# Patient Record
Sex: Male | Born: 1960 | Race: Black or African American | Hispanic: No | Smoking: Current every day smoker
Health system: Southern US, Community
[De-identification: ages and names within clinical notes are randomized; demographics above are authoritative.]

## PROBLEM LIST (undated history)

## (undated) DIAGNOSIS — E119 Type 2 diabetes mellitus without complications: Secondary | ICD-10-CM

## (undated) DIAGNOSIS — T7840XA Allergy, unspecified, initial encounter: Secondary | ICD-10-CM

## (undated) DIAGNOSIS — I10 Essential (primary) hypertension: Secondary | ICD-10-CM

## (undated) DIAGNOSIS — I639 Cerebral infarction, unspecified: Secondary | ICD-10-CM

## (undated) DIAGNOSIS — K219 Gastro-esophageal reflux disease without esophagitis: Secondary | ICD-10-CM

## (undated) DIAGNOSIS — R52 Pain, unspecified: Secondary | ICD-10-CM

## (undated) DIAGNOSIS — F329 Major depressive disorder, single episode, unspecified: Secondary | ICD-10-CM

## (undated) DIAGNOSIS — F419 Anxiety disorder, unspecified: Secondary | ICD-10-CM

## (undated) DIAGNOSIS — B20 Human immunodeficiency virus [HIV] disease: Secondary | ICD-10-CM

## (undated) DIAGNOSIS — F32A Depression, unspecified: Secondary | ICD-10-CM

## (undated) DIAGNOSIS — R200 Anesthesia of skin: Secondary | ICD-10-CM

## (undated) DIAGNOSIS — N200 Calculus of kidney: Secondary | ICD-10-CM

## (undated) HISTORY — DX: Anxiety disorder, unspecified: F41.9

## (undated) HISTORY — DX: Cerebral infarction, unspecified: I63.9

## (undated) HISTORY — DX: Anesthesia of skin: R20.0

## (undated) HISTORY — DX: Allergy, unspecified, initial encounter: T78.40XA

## (undated) HISTORY — DX: Depression, unspecified: F32.A

## (undated) HISTORY — DX: Type 2 diabetes mellitus without complications: E11.9

## (undated) HISTORY — DX: Essential (primary) hypertension: I10

## (undated) HISTORY — DX: Calculus of kidney: N20.0

## (undated) HISTORY — DX: Gastro-esophageal reflux disease without esophagitis: K21.9

## (undated) HISTORY — DX: Major depressive disorder, single episode, unspecified: F32.9

## (undated) HISTORY — DX: Human immunodeficiency virus (HIV) disease: B20

## (undated) HISTORY — DX: Pain, unspecified: R52

---
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
## 2014-06-29 DIAGNOSIS — N200 Calculus of kidney: Secondary | ICD-10-CM

## 2014-06-29 HISTORY — PX: LITHOTRIPSY: SUR834

## 2014-06-29 HISTORY — PX: TENDON REPAIR: SHX5111

## 2014-06-29 HISTORY — DX: Calculus of kidney: N20.0

## 2015-10-07 DIAGNOSIS — I639 Cerebral infarction, unspecified: Secondary | ICD-10-CM

## 2015-10-07 HISTORY — DX: Cerebral infarction, unspecified: I63.9

## 2016-03-10 ENCOUNTER — Encounter: Payer: Self-pay | Admitting: Internal Medicine

## 2016-04-03 ENCOUNTER — Encounter: Payer: Self-pay | Admitting: Infectious Diseases

## 2016-04-03 ENCOUNTER — Ambulatory Visit (INDEPENDENT_AMBULATORY_CARE_PROVIDER_SITE_OTHER): Payer: Medicare HMO | Admitting: Infectious Diseases

## 2016-04-03 VITALS — BP 145/103 | HR 77 | Temp 98.1°F | Ht 68.0 in | Wt 204.0 lb

## 2016-04-03 DIAGNOSIS — Z21 Asymptomatic human immunodeficiency virus [HIV] infection status: Secondary | ICD-10-CM | POA: Diagnosis not present

## 2016-04-03 DIAGNOSIS — I6359 Cerebral infarction due to unspecified occlusion or stenosis of other cerebral artery: Secondary | ICD-10-CM

## 2016-04-03 DIAGNOSIS — Z720 Tobacco use: Secondary | ICD-10-CM | POA: Diagnosis not present

## 2016-04-03 DIAGNOSIS — E118 Type 2 diabetes mellitus with unspecified complications: Secondary | ICD-10-CM | POA: Diagnosis not present

## 2016-04-03 DIAGNOSIS — Z79899 Other long term (current) drug therapy: Secondary | ICD-10-CM

## 2016-04-03 DIAGNOSIS — I639 Cerebral infarction, unspecified: Secondary | ICD-10-CM | POA: Insufficient documentation

## 2016-04-03 DIAGNOSIS — Z113 Encounter for screening for infections with a predominantly sexual mode of transmission: Secondary | ICD-10-CM

## 2016-04-03 MED ORDER — ABACAVIR SULFATE-LAMIVUDINE 600-300 MG PO TABS: 1.0000 | ORAL_TABLET | Freq: Every day | ORAL | 3 refills | Status: DC

## 2016-04-03 MED ORDER — RALTEGRAVIR POTASSIUM 400 MG PO TABS: 400.0000 mg | ORAL_TABLET | Freq: Two times a day (BID) | ORAL | 3 refills | Status: DC

## 2016-04-03 MED ORDER — ASPIRIN EC 81 MG PO TBEC: 81.0000 mg | DELAYED_RELEASE_TABLET | Freq: Every day | ORAL | 3 refills | Status: DC
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
## 2016-04-03 NOTE — Assessment & Plan Note (Signed)
Need his vax records He is given condoms He has gotten flu shot.  Will recheck his labs today Will see him back in 4-5 months.  Fortunately he has PCP

## 2016-04-07 ENCOUNTER — Other Ambulatory Visit: Payer: Medicare HMO

## 2016-04-07 ENCOUNTER — Other Ambulatory Visit (HOSPITAL_COMMUNITY)
Admission: RE | Admit: 2016-04-07 | Discharge: 2016-04-07 | Disposition: A | Discharge status: Home / Self Care | Payer: Medicare HMO | Source: Ambulatory Visit | Attending: Infectious Diseases | Admitting: Infectious Diseases

## 2016-04-07 DIAGNOSIS — Z113 Encounter for screening for infections with a predominantly sexual mode of transmission: Secondary | ICD-10-CM | POA: Insufficient documentation

## 2016-04-07 DIAGNOSIS — Z21 Asymptomatic human immunodeficiency virus [HIV] infection status: Secondary | ICD-10-CM

## 2016-04-07 DIAGNOSIS — Z79899 Other long term (current) drug therapy: Secondary | ICD-10-CM

## 2016-04-07 LAB — CBC
HCT: 45.9 % (ref 38.5–50.0)
HEMOGLOBIN: 14.9 g/dL (ref 13.2–17.1)
MCH: 30.2 pg (ref 27.0–33.0)
MCHC: 32.5 g/dL (ref 32.0–36.0)
MCV: 92.9 fL (ref 80.0–100.0)
MPV: 10.7 fL (ref 7.5–12.5)
Platelets: 277 10*3/uL (ref 140–400)
RBC: 4.94 MIL/uL (ref 4.20–5.80)
RDW: 13.3 % (ref 11.0–15.0)
WBC: 9.7 10*3/uL (ref 3.8–10.8)

## 2016-04-07 LAB — TSH: TSH: 0.85 m[IU]/L (ref 0.40–4.50)

## 2016-04-07 NOTE — Addendum Note (Signed)
Addended by: Dolan Amen D on: 04/07/2016 02:50 PM   Modules accepted: Orders

## 2016-04-08 LAB — URINE CYTOLOGY ANCILLARY ONLY
CHLAMYDIA, DNA PROBE: NEGATIVE
Neisseria Gonorrhea: NEGATIVE

## 2016-04-08 LAB — COMPREHENSIVE METABOLIC PANEL
ALBUMIN: 4.4 g/dL (ref 3.6–5.1)
ALT: 15 U/L (ref 9–46)
AST: 16 U/L (ref 10–35)
Alkaline Phosphatase: 64 U/L (ref 40–115)
BUN: 29 mg/dL — ABNORMAL HIGH (ref 7–25)
CHLORIDE: 95 mmol/L — AB (ref 98–110)
CO2: 23 mmol/L (ref 20–31)
CREATININE: 1.86 mg/dL — AB (ref 0.70–1.33)
Calcium: 9.8 mg/dL (ref 8.6–10.3)
Glucose, Bld: 373 mg/dL — ABNORMAL HIGH (ref 65–99)
Potassium: 5 mmol/L (ref 3.5–5.3)
SODIUM: 131 mmol/L — AB (ref 135–146)
Total Bilirubin: 0.3 mg/dL (ref 0.2–1.2)
Total Protein: 7.3 g/dL (ref 6.1–8.1)

## 2016-04-08 LAB — HEPATITIS C ANTIBODY: HCV AB: NEGATIVE

## 2016-04-08 LAB — LIPID PANEL
Cholesterol: 190 mg/dL (ref 125–200)
HDL: 42 mg/dL (ref >=40)
LDL CALC: 90 mg/dL (ref <130)
Total CHOL/HDL Ratio: 4.5 Ratio (ref <=5.0)
Triglycerides: 291 mg/dL — ABNORMAL HIGH (ref <150)
VLDL: 58 mg/dL — ABNORMAL HIGH (ref <30)

## 2016-04-08 LAB — HEPATITIS B SURFACE ANTIBODY,QUALITATIVE: Hep B S Ab: NEGATIVE

## 2016-04-08 LAB — HEPATITIS B SURFACE ANTIGEN: HEP B S AG: NEGATIVE

## 2016-04-08 LAB — HIV-1 RNA ULTRAQUANT REFLEX TO GENTYP+
HIV 1 RNA QUANT: 46 {copies}/mL — AB (ref <20)
HIV-1 RNA QUANT, LOG: 1.66 {Log_copies}/mL — AB (ref <1.30)

## 2016-04-08 LAB — RPR

## 2016-04-08 LAB — HEPATITIS A ANTIBODY, TOTAL: Hep A Total Ab: REACTIVE — AB

## 2016-04-08 LAB — HEMOGLOBIN A1C
HEMOGLOBIN A1C: 11.3 % — AB (ref <5.7)
MEAN PLASMA GLUCOSE: 278 mg/dL

## 2016-04-08 LAB — T-HELPER CELL (CD4) - (RCID CLINIC ONLY)
CD4 % Helper T Cell: 46 % (ref 33–55)
CD4 T Cell Abs: 1540 /uL (ref 400–2700)

## 2016-04-09 LAB — QUANTIFERON TB GOLD ASSAY (BLOOD)
INTERFERON GAMMA RELEASE ASSAY: NEGATIVE
QUANTIFERON NIL VALUE: 0.02 [IU]/mL
QUANTIFERON TB AG MINUS NIL: 0 [IU]/mL

## 2016-04-15 LAB — HLA B*5701: HLA-B 5701 W/RFLX HLA-B HIGH: NEGATIVE

## 2016-04-27 ENCOUNTER — Ambulatory Visit (AMBULATORY_SURGERY_CENTER): Payer: Self-pay | Admitting: *Deleted

## 2016-04-27 VITALS — Ht 68.0 in | Wt 204.6 lb

## 2016-04-27 DIAGNOSIS — Z1211 Encounter for screening for malignant neoplasm of colon: Secondary | ICD-10-CM

## 2016-04-27 MED ORDER — NA SULFATE-K SULFATE-MG SULF 17.5-3.13-1.6 GM/177ML PO SOLN: 1.0000 | Freq: Once | ORAL | 0 refills | Status: AC

## 2016-04-27 NOTE — Progress Notes (Signed)
No allergies to eggs or soy. No problems with anesthesia.  Pt given Emmi instructions for colonoscopy  No oxygen use  No diet drug use  

## 2016-05-11 ENCOUNTER — Ambulatory Visit (AMBULATORY_SURGERY_CENTER): Payer: Medicare HMO | Admitting: Internal Medicine

## 2016-05-11 ENCOUNTER — Encounter: Payer: Self-pay | Admitting: Internal Medicine

## 2016-05-11 VITALS — BP 109/69 | HR 89 | Temp 98.6°F | Resp 17 | Ht 68.0 in | Wt 204.0 lb

## 2016-05-11 DIAGNOSIS — Z1211 Encounter for screening for malignant neoplasm of colon: Secondary | ICD-10-CM

## 2016-05-11 DIAGNOSIS — Z1212 Encounter for screening for malignant neoplasm of rectum: Secondary | ICD-10-CM

## 2016-05-11 DIAGNOSIS — D120 Benign neoplasm of cecum: Secondary | ICD-10-CM | POA: Diagnosis not present

## 2016-05-11 DIAGNOSIS — D128 Benign neoplasm of rectum: Secondary | ICD-10-CM

## 2016-05-11 DIAGNOSIS — D129 Benign neoplasm of anus and anal canal: Secondary | ICD-10-CM

## 2016-05-11 LAB — GLUCOSE, CAPILLARY
GLUCOSE-CAPILLARY: 139 mg/dL — AB (ref 65–99)
GLUCOSE-CAPILLARY: 145 mg/dL — AB (ref 65–99)

## 2016-05-11 MED ORDER — SODIUM CHLORIDE 0.9 % IV SOLN: 500.0000 mL | INTRAVENOUS | Status: DC

## 2016-05-11 NOTE — Progress Notes (Signed)
Report given to PACU RN, vss 

## 2016-05-11 NOTE — Op Note (Signed)
[facility] Patient Name: Donald Ross Procedure Date: 05/11/2016 10:40 AM MRN: [id number] Endoscopist: Docia Chuck. Henrene Pastor , MD Age: 55 Referring MD:  Date of Birth: [birth year] Gender: Male Account #: [id number] Procedure:                Colonoscopy, with cold snare polypectomy X2 Indications:              Screening for colorectal malignant neoplasm Medicines:                Monitored Anesthesia Care Procedure:                Pre-Anesthesia Assessment:                           - Prior to the procedure, a History and Physical                            was performed, and patient medications and                            allergies were reviewed. The patient's tolerance of                            previous anesthesia was also reviewed. The risks                            and benefits of the procedure and the sedation                            options and risks were discussed with the patient.                            All questions were answered, and informed consent                            was obtained. Prior Anticoagulants: The patient has                            taken no previous anticoagulant or antiplatelet                            agents. ASA Grade Assessment: II - A patient with                            mild systemic disease. After reviewing the risks                            and benefits, the patient was deemed in                            satisfactory condition to undergo the procedure.                           After obtaining informed consent, the colonoscope  was passed under direct vision. Throughout the                            procedure, the patient's blood pressure, pulse, and                            oxygen saturations were monitored continuously. The                            Model CF-HQ190L [id number]) scope was introduced                            through the anus and advanced to the the cecum,                 identified by appendiceal orifice and ileocecal                            valve. The ileocecal valve, appendiceal orifice,                            and rectum were photographed. The quality of the                            bowel preparation was good. The colonoscopy was                            performed without difficulty. The patient tolerated                            the procedure well. The bowel preparation used was                            SUPREP. Scope In: 10:42:47 AM Scope Out: 11:01:49 AM Scope Withdrawal Time: 0 hours 11 minutes 32 seconds  Total Procedure Duration: 0 hours 19 minutes 2 seconds  Findings:                 Two polyps were found in the rectum and cecum. The                            polyps were 3 to 6 mm in size. These polyps were                            removed with a cold snare. Resection and retrieval                            were complete.                           Multiple diverticula were found in the left colon.                           The exam was otherwise without abnormality on  direct and retroflexion views. Complications:            No immediate complications. Estimated blood loss:                            None. Estimated Blood Loss:     Estimated blood loss: none. Impression:               - Two 3 to 6 mm polyps in the rectum and in the                            cecum, removed with a cold snare. Resected and                            retrieved.                           - Diverticulosis in the left colon.                           - The examination was otherwise normal on direct                            and retroflexion views. Recommendation:           - Repeat colonoscopy in 5 years for surveillance,                            if either polyp adenomatous.                           - Patient has a contact number available for                            emergencies. The signs and symptoms  of potential                            delayed complications were discussed with the                            patient. Return to normal activities tomorrow.                            Written discharge instructions were provided to the                            patient.                           - Resume previous diet.                           - Continue present medications.                           - Await pathology results. Docia Chuck. Henrene Pastor, MD 05/11/2016 11:07:11 AM This report has been signed electronically.

## 2016-05-11 NOTE — Patient Instructions (Signed)
YOU HAD AN ENDOSCOPIC PROCEDURE TODAY AT [facility]:   Refer to the procedure report that was given to you for any specific questions about what was found during the examination.  If the procedure report does not answer your questions, please call your gastroenterologist to clarify.  If you requested that your care partner not be given the details of your procedure findings, then the procedure report has been included in a sealed envelope for you to review at your convenience later.  YOU SHOULD EXPECT: Some feelings of bloating in the abdomen. Passage of more gas than usual.  Walking can help get rid of the air that was put into your GI tract during the procedure and reduce the bloating. If you had a lower endoscopy (such as a colonoscopy or flexible sigmoidoscopy) you may notice spotting of blood in your stool or on the toilet paper. If you underwent a bowel prep for your procedure, you may not have a normal bowel movement for a few days.  Please Note:  You might notice some irritation and congestion in your nose or some drainage.  This is from the oxygen used during your procedure.  There is no need for concern and it should clear up in a day or so.  SYMPTOMS TO REPORT IMMEDIATELY:   Following lower endoscopy (colonoscopy or flexible sigmoidoscopy):  Excessive amounts of blood in the stool  Significant tenderness or worsening of abdominal pains  Swelling of the abdomen that is new, acute  Fever of 100F or higher    For urgent or emergent issues, a gastroenterologist can be reached at any hour by calling [phone].   DIET:  We do recommend a small meal at first, but then you may proceed to your regular diet.  Drink plenty of fluids but you should avoid alcoholic beverages for 24 hours.  ACTIVITY:  You should plan to take it easy for the rest of today and you should NOT DRIVE or use heavy machinery until tomorrow (because of the sedation medicines used during the test).     FOLLOW UP: Our staff will call the number listed on your records the next business day following your procedure to check on you and address any questions or concerns that you may have regarding the information given to you following your procedure. If we do not reach you, we will leave a message.  However, if you are feeling well and you are not experiencing any problems, there is no need to return our call.  We will assume that you have returned to your regular daily activities without incident.  If any biopsies were taken you will be contacted by phone or by letter within the next 1-3 weeks.  Please call us at [phone] if you have not heard about the biopsies in 3 weeks.    SIGNATURES/CONFIDENTIALITY: You and/or your care partner have signed paperwork which will be entered into your electronic medical record.  These signatures attest to the fact that that the information above on your After Visit Summary has been reviewed and is understood.  Full responsibility of the confidentiality of this discharge information lies with you and/or your care-partner.   INFORMATION ON POLYPS,DIVERTICULOSIS ,AND HIGH FIBER DIET GIVEN TO YOU TODAY   CONTINUE PREVIOUS DIET AND MEDICATIONS   AWAIT PATHOLOGY RESULTS

## 2016-05-11 NOTE — Progress Notes (Signed)
Called to room to assist during endoscopic procedure.  Patient ID and intended procedure confirmed with present staff. Received instructions for my participation in the procedure from the performing physician.  

## 2016-05-12 ENCOUNTER — Telehealth: Payer: Self-pay

## 2016-05-12 NOTE — Telephone Encounter (Signed)
  Follow up Call-  Call back number 05/11/2016  Post procedure Call Back phone  # [phone]  Permission to leave phone message Yes     Patient questions:  Do you have a fever, pain , or abdominal swelling? No. Pain Score  0 *  Have you tolerated food without any problems? Yes.    Have you been able to return to your normal activities? Yes.    Do you have any questions about your discharge instructions: Diet   No. Medications  No. Follow up visit  No.  Do you have questions or concerns about your Care? No.  Actions: * If pain score is 4 or above: No action needed, pain <4.

## 2016-05-18 ENCOUNTER — Encounter: Payer: Self-pay | Admitting: Internal Medicine

## 2016-07-22 ENCOUNTER — Other Ambulatory Visit: Payer: Medicare HMO

## 2016-08-05 ENCOUNTER — Encounter: Payer: Self-pay | Admitting: Infectious Diseases

## 2016-08-05 ENCOUNTER — Ambulatory Visit (INDEPENDENT_AMBULATORY_CARE_PROVIDER_SITE_OTHER): Payer: Medicare HMO | Admitting: Infectious Diseases

## 2016-08-05 VITALS — BP 145/96 | HR 128 | Temp 97.9°F | Ht 68.0 in | Wt 203.0 lb

## 2016-08-05 DIAGNOSIS — E785 Hyperlipidemia, unspecified: Secondary | ICD-10-CM

## 2016-08-05 DIAGNOSIS — E1340 Other specified diabetes mellitus with diabetic neuropathy, unspecified: Secondary | ICD-10-CM | POA: Insufficient documentation

## 2016-08-05 DIAGNOSIS — B20 Human immunodeficiency virus [HIV] disease: Secondary | ICD-10-CM

## 2016-08-05 DIAGNOSIS — E118 Type 2 diabetes mellitus with unspecified complications: Secondary | ICD-10-CM | POA: Diagnosis not present

## 2016-08-05 DIAGNOSIS — Z23 Encounter for immunization: Secondary | ICD-10-CM

## 2016-08-05 DIAGNOSIS — Z21 Asymptomatic human immunodeficiency virus [HIV] infection status: Secondary | ICD-10-CM | POA: Diagnosis not present

## 2016-08-05 DIAGNOSIS — E1169 Type 2 diabetes mellitus with other specified complication: Secondary | ICD-10-CM | POA: Diagnosis not present

## 2016-08-05 DIAGNOSIS — Z113 Encounter for screening for infections with a predominantly sexual mode of transmission: Secondary | ICD-10-CM | POA: Diagnosis not present

## 2016-08-05 LAB — LIPID PANEL
CHOL/HDL RATIO: 2.9 ratio (ref <5.0)
CHOLESTEROL: 118 mg/dL (ref <200)
HDL: 41 mg/dL (ref >40)
LDL CALC: 44 mg/dL (ref <100)
TRIGLYCERIDES: 165 mg/dL — AB (ref <150)
VLDL: 33 mg/dL — AB (ref <30)

## 2016-08-05 LAB — CBC
HCT: 40 % (ref 38.5–50.0)
Hemoglobin: 12.8 g/dL — ABNORMAL LOW (ref 13.2–17.1)
MCH: 28.3 pg (ref 27.0–33.0)
MCHC: 32 g/dL (ref 32.0–36.0)
MCV: 88.5 fL (ref 80.0–100.0)
MPV: 10 fL (ref 7.5–12.5)
PLATELETS: 392 10*3/uL (ref 140–400)
RBC: 4.52 MIL/uL (ref 4.20–5.80)
RDW: 15.4 % — ABNORMAL HIGH (ref 11.0–15.0)
WBC: 8.8 10*3/uL (ref 3.8–10.8)

## 2016-08-05 LAB — COMPREHENSIVE METABOLIC PANEL
ALT: 15 U/L (ref 9–46)
AST: 17 U/L (ref 10–35)
Albumin: 4 g/dL (ref 3.6–5.1)
Alkaline Phosphatase: 56 U/L (ref 40–115)
BUN: 22 mg/dL (ref 7–25)
CHLORIDE: 107 mmol/L (ref 98–110)
CO2: 21 mmol/L (ref 20–31)
CREATININE: 1.78 mg/dL — AB (ref 0.70–1.33)
Calcium: 9.3 mg/dL (ref 8.6–10.3)
Glucose, Bld: 106 mg/dL — ABNORMAL HIGH (ref 65–99)
POTASSIUM: 4.7 mmol/L (ref 3.5–5.3)
Sodium: 138 mmol/L (ref 135–146)
TOTAL PROTEIN: 6.8 g/dL (ref 6.1–8.1)
Total Bilirubin: 0.2 mg/dL (ref 0.2–1.2)

## 2016-08-05 MED ORDER — ABACAVIR-DOLUTEGRAVIR-LAMIVUD 600-50-300 MG PO TABS: 1.0000 | ORAL_TABLET | Freq: Every day | ORAL | 5 refills | Status: DC

## 2016-08-05 MED ORDER — ABACAVIR-DOLUTEGRAVIR-LAMIVUD 600-50-300 MG PO TABS: 1.0000 | ORAL_TABLET | Freq: Every day | ORAL | Status: DC

## 2016-08-05 NOTE — Assessment & Plan Note (Signed)
He asks for pain rx which we don't give.  He will f/u with his PCP Not clear if his pain is from HIV, CVA, DM or any/all of above.

## 2016-08-05 NOTE — Addendum Note (Signed)
Addended by: Roma Kayser on: 08/05/2016 01:41 PM   Modules accepted: Orders

## 2016-08-05 NOTE — Addendum Note (Signed)
Addended by: Vivyan Biggers C on: 08/05/2016 11:51 AM   Modules accepted: Orders
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
## 2016-08-05 NOTE — Assessment & Plan Note (Signed)
Will change him to triumeq.  Given condoms.  Has gotten flu shot  Needs mening vax.  Check labs today rtc in 6 months.

## 2016-08-05 NOTE — Assessment & Plan Note (Signed)
Will f/u with PCP Will have him seen by podiatry Encourage wt loss.  Has seen ophtho.

## 2016-08-05 NOTE — Assessment & Plan Note (Signed)
Will check his lipids today.  Continue fibrate.  F/u with PCP

## 2016-08-05 NOTE — Addendum Note (Signed)
Addended by: Landis Gandy on: 08/05/2016 03:09 PM   Modules accepted: Orders

## 2016-08-06 LAB — T-HELPER CELL (CD4) - (RCID CLINIC ONLY)
CD4 T CELL HELPER: 49 % (ref 33–55)
CD4 T Cell Abs: 1710 /uL (ref 400–2700)

## 2016-08-06 LAB — RPR

## 2016-08-12 LAB — HIV-1 RNA QUANT-NO REFLEX-BLD
HIV 1 RNA Quant: <20 copies/mL
HIV-1 RNA Quant, Log: <1.3 Log copies/mL

## 2016-11-05 ENCOUNTER — Ambulatory Visit (INDEPENDENT_AMBULATORY_CARE_PROVIDER_SITE_OTHER): Payer: Medicare HMO | Admitting: Podiatry

## 2016-11-05 ENCOUNTER — Encounter: Payer: Self-pay | Admitting: Podiatry

## 2016-11-05 VITALS — BP 158/112 | HR 92

## 2016-11-05 DIAGNOSIS — Q828 Other specified congenital malformations of skin: Secondary | ICD-10-CM

## 2016-11-05 DIAGNOSIS — E1142 Type 2 diabetes mellitus with diabetic polyneuropathy: Secondary | ICD-10-CM

## 2016-11-05 DIAGNOSIS — B351 Tinea unguium: Secondary | ICD-10-CM | POA: Diagnosis not present

## 2016-11-05 DIAGNOSIS — M79676 Pain in unspecified toe(s): Secondary | ICD-10-CM

## 2016-11-05 NOTE — Progress Notes (Signed)
   Subjective:    Patient ID: Donald Ross, male    DOB: [birth year], 56 y.o.   MRN: [id number]  HPI this patient presents the office for an evaluation of his diabetic feet. Patient was referred to this office by Dr[facility]. He states says he has long thick painful nails when he walks and wears his shoes. He also says he has painful callus on the bottom of both feet. Patient states he is diabetic with neuropathy. Also, he says he had a mild stroke on his left side. He presents the office today for preventative foot care services and a diabetic exam on of his diabetic feet    Review of Systems  Eyes: Positive for pain, redness and itching.  Endocrine: Positive for cold intolerance and heat intolerance.       Excessive thirst  Musculoskeletal: Positive for back pain and gait problem.       Muscle pain,joint pain  Skin:       Change in nails  Neurological: Positive for weakness, light-headedness, numbness and headaches.  Hematological:       Slow to heal  Psychiatric/Behavioral: Positive for behavioral problems.       Objective:   Physical Exam GENERAL APPEARANCE: Alert, conversant. Appropriately groomed. No acute distress.  VASCULAR: Pedal pulses are  palpable at  [facility] and PT bilateral.  Capillary refill time is immediate to all digits,  Normal temperature gradient.   NEUROLOGIC: sensation is diminished l to 5.07 monofilament at 5/5 sites bilateral.  Light touch is intact bilateral, Muscle strength normal.  MUSCULOSKELETAL: acceptable muscle strength, tone and stability bilateral.  Intrinsic muscluature intact bilateral.  Rectus appearance of foot and digits noted bilateral.   DERMATOLOGIC: skin color, texture, and turgor are within normal limits.  No preulcerative lesions or ulcers  are seen, no interdigital maceration noted.  No open lesions present.  . No drainage noted. Porokeratosis sub 1  B/L with callus sub hallux  B/L and finally callus sub 5th left  foot.  NAILS  Thick disfigured discolored nails both feet.         Assessment & Plan:  Onychomycosis  B/L  Porokeratosis B/L.  Porokeratosis sub 5th metatarsal left foot  IE  Debridemnt of callus  Debridement of nails  B/L.     Gardiner Barefoot DPM

## 2017-03-09 ENCOUNTER — Ambulatory Visit (INDEPENDENT_AMBULATORY_CARE_PROVIDER_SITE_OTHER): Payer: Medicare HMO | Admitting: Neurology

## 2017-03-09 ENCOUNTER — Encounter: Payer: Self-pay | Admitting: Neurology

## 2017-03-09 VITALS — BP 141/90 | HR 106 | Ht 68.0 in | Wt 196.0 lb

## 2017-03-09 DIAGNOSIS — R52 Pain, unspecified: Secondary | ICD-10-CM | POA: Diagnosis not present

## 2017-03-09 DIAGNOSIS — I6359 Cerebral infarction due to unspecified occlusion or stenosis of other cerebral artery: Secondary | ICD-10-CM | POA: Diagnosis not present

## 2017-03-09 DIAGNOSIS — E118 Type 2 diabetes mellitus with unspecified complications: Secondary | ICD-10-CM

## 2017-03-09 DIAGNOSIS — Z21 Asymptomatic human immunodeficiency virus [HIV] infection status: Secondary | ICD-10-CM

## 2017-03-09 MED ORDER — NORTRIPTYLINE HCL 25 MG PO CAPS: 50.0000 mg | ORAL_CAPSULE | Freq: Every day | ORAL | 11 refills | Status: DC

## 2017-03-09 NOTE — Progress Notes (Signed)
PATIENT: Donald Ross DOB: 1961/02/20  Chief Complaint  Patient presents with  . Numbness/Pain    Reports persistent numbness on left side of body. He has constant pain in neck, shoulders, back, legs and feet. No relief of symptoms with duloxetine or topiramate.  He is currently taking cyclobenzaprine, gabapentin, Tramadol which is only mildly helpful.  He has history of CVA in April 2017.  Marland Kitchen PCP    Bernerd Limbo, MD     HISTORICAL  Donald Ross is a 57 year old male, seen in refer by his primary care physician Dr. Bernerd Limbo, for evaluation of persistent numbness on the left side of the body, also complains of pain in the neck, shoulder, initial evaluation was on September eleventh 2018.  I have reviewed and summarized the referring note, he had a history of diabetes, diabetic peripheral neuropathy, history of stroke 2017 with left-sided hemiparesthesia, hypertension, hyperlipidemia, depression, HIV since 2000, been complying with his anti-virus medications, CD4 is 1710 in February 2018  He complains of chronic diffuse body achy pain since 2005, the year he was diagnosed with diabetes, he had previous work related bilateral trapezius muscle strain, complains of neck pain, bilateral shoulder pain, traveling down to his spine, low back pain, lower extremity pain, bilateral feet paresthesia due to his diabetic peripheral neuropathy, previously he was treated as [place], reported good response to Vicodin 10 mg twice a day, he is apparently not happy with his current pain control with tramadol alone, he has tried over-the-counter NSAIDs, which seems to help him, but he has developed abnormal kidney function, Tylenol did not help, reported previously has tried gabapentin up to 800 mg 3 times a day with limited help, insurance would not pay for Lyrica, Cymbalta was not helpful,  He still has a left side numbness, paresthesia, mild gait abnormality, tendency to fall due to left side leg  of sensation, drop things from his left hand,  We reviewed laboratory evaluation in 2018, A1c was 7.9, creatinine is 1.55,UA showed protein 100, normal CBC, hemoglobin 14.7, negative hepatitis C, check lites right 433, cholesterol was 222, negative RPR, HIV was positive, CD4 count in February 2018 was 1710, 49%  REVIEW OF SYSTEMS: Full 14 system review of systems performed and notable only for memory loss, headaches, numbness, weakness, insomnia, snoring, depression anxiety not enough sleep decreased energy disinterested in activities, increased thirst, achy muscles, fatigue  ALLERGIES: Allergies  Allergen Reactions  . Clonazepam Swelling    HOME MEDICATIONS: Current Outpatient Prescriptions  Medication Sig Dispense Refill  . abacavir-dolutegravir-lamiVUDine (TRIUMEQ) 600-50-300 MG tablet Take 1 tablet by mouth daily. 30 tablet 5  . aspirin EC 81 MG tablet Take 1 tablet (81 mg total) by mouth daily. 90 tablet 3  . cyclobenzaprine (FLEXERIL) 10 MG tablet Take 10 mg by mouth at bedtime.    . fenofibrate micronized (LOFIBRA) 134 MG capsule Take 134 mg by mouth daily before breakfast.    . FLUoxetine (PROZAC) 20 MG capsule Take 20 mg by mouth daily.    Marland Kitchen gabapentin (NEURONTIN) 800 MG tablet Take 800 mg by mouth 3 (three) times daily.    . Insulin Glargine (LANTUS SOLOSTAR) 100 UNIT/ML Solostar Pen Inject into the skin.    Marland Kitchen losartan-hydrochlorothiazide (HYZAAR) 50-12.5 MG tablet Take 1 tablet by mouth daily.    . metFORMIN (GLUCOPHAGE) 1000 MG tablet Take 1,000 mg by mouth 2 (two) times daily with a meal.    . omeprazole (PRILOSEC) 40 MG capsule Take 40 mg by mouth  daily.    . sitaGLIPtin (JANUVIA) 50 MG tablet Take 50 mg by mouth.    . traMADol (ULTRAM) 50 MG tablet Take by mouth 2 (two) times daily.     Current Facility-Administered Medications  Medication Dose Route Frequency Provider Last Rate Last Dose  . 0.9 %  sodium chloride infusion  500 mL Intravenous Continuous Irene Shipper, MD       . abacavir-dolutegravir-lamiVUDine (TRIUMEQ) 096-04-540 MG per tablet 1 tablet  1 tablet Oral Daily Campbell Riches, MD        PAST MEDICAL HISTORY: Past Medical History:  Diagnosis Date  . Allergy   . Anxiety   . Depression   . Diabetes mellitus without complication ([place])   . GERD (gastroesophageal reflux disease)   . HIV infection ([place])   . Hypertension   . Kidney stones 2016  . Numbness   . Pain   . Stroke [facility]) 10/07/2015    PAST SURGICAL HISTORY: Past Surgical History:  Procedure Laterality Date  . EYE SURGERY Bilateral 1968  . LITHOTRIPSY  2016  . TENDON REPAIR Left 2016   thumb    FAMILY HISTORY: Family History  Problem Relation Age of Onset  . CVA Mother        3 strokes  . Alzheimer's disease Mother   . Colon cancer Neg Hx     SOCIAL HISTORY:  Social History   Social History  . Marital status: Legally Separated    Spouse name: N/A  . Number of children: 2  . Years of education: GED   Occupational History  . Unemployed    Social History Main Topics  . Smoking status: Current Every Day Smoker    Packs/day: 0.50    Years: 39.00    Types: Cigarettes    Start date: 04/03/1977  . Smokeless tobacco: Never Used  . Alcohol use No  . Drug use: No  . Sexual activity: Yes    Partners: Female    Birth control/ protection: None     Comment: condomds given   Other Topics Concern  . Not on file   Social History Narrative   Lives at home with fiancee.   Right-handed.   Drinks 3-liter of soda every three days.     PHYSICAL EXAM   Vitals:   03/09/17 1327  BP: (!) 141/90  Pulse: (!) 106  Weight: 196 lb (88.9 kg)  Height: 5\' 8"  (1.727 m)    Not recorded      Body mass index is 29.8 kg/m.  PHYSICAL EXAMNIATION:  Gen: NAD, conversant, well nourised, obese, well groomed                     Cardiovascular: Regular rate rhythm, no peripheral edema, warm, nontender. Eyes: Conjunctivae clear without exudates or hemorrhage Neck:  Supple, no carotid bruits. Pulmonary: Clear to auscultation bilaterally   NEUROLOGICAL EXAM:  MENTAL STATUS:Depressed looking middle-age male Speech:    Speech is normal; fluent and spontaneous with normal comprehension.  Cognition:     Orientation to time, place and person     Normal recent and remote memory     Normal Attention span and concentration     Normal Language, naming, repeating,spontaneous speech     Fund of knowledge   CRANIAL NERVES: CN II: Visual fields are full to confrontation. Fundoscopic exam is normal with sharp discs and no vascular changes. Pupils are round equal and briskly reactive to light. CN III, IV, VI: extraocular movement  are normal. No ptosis. CN V: Facial sensation is intact to pinprick in all 3 divisions bilaterally. Corneal responses are intact.  CN VII: Face is symmetric with normal eye closure and smile. CN VIII: Hearing is normal to rubbing fingers CN IX, X: Palate elevates symmetrically. Phonation is normal. CN XI: Head turning and shoulder shrug are intact CN XII: Tongue is midline with normal movements and no atrophy.  MOTOR: There is no pronator drift of out-stretched arms. Muscle bulk and tone are normal. Muscle strength is normal.  REFLEXES: Reflexes are 2+ and symmetric at the biceps, triceps, knees, and ankles. Plantar responses are flexor.  SENSORY: Left-sided graphic agnosia  COORDINATION: Rapid alternating movements and fine finger movements are intact. There is no dysmetria on finger-to-nose and heel-knee-shin.    GAIT/STANCE: Tends to drag his left leg, fairly steady   DIAGNOSTIC DATA (LABS, IMAGING, TESTING) - I reviewed patient records, labs, notes, testing and imaging myself where available.   ASSESSMENT AND PLAN  Donald Ross is a 56 y.o. male   Diffuse body achy pain History of stroke with residual left-sided hemiparesthesia  MRI of the brain without contrast  Keep aspirin daily  He has vascular risk factors  of hypertension, diabetes, under suboptimal control, hyperlipidemia, HIV,  Laboratory evaluation to rule out inflammatory etiology  Nortriptyline 25 mg titrating to 50 mg every night  Marcial Pacas, M.D. Ph.D.  [facility] [address] [place], [place] [address] Ph: [phone] Fax: [phone]  CC:Bernerd Limbo, MD

## 2017-03-10 ENCOUNTER — Encounter: Payer: Self-pay | Admitting: *Deleted

## 2017-03-10 LAB — SEDIMENTATION RATE: Sed Rate: 32 mm/hr — ABNORMAL HIGH (ref 0–30)

## 2017-03-10 LAB — CK: Total CK: 188 U/L (ref 24–204)

## 2017-03-10 LAB — C-REACTIVE PROTEIN: CRP: 3.2 mg/L (ref 0.0–4.9)

## 2017-03-10 LAB — HEPATITIS PANEL, ACUTE
HEP A IGM: NEGATIVE
HEP B S AG: NEGATIVE
Hep B C IgM: NEGATIVE
Hep C Virus Ab: <0.1 s/co ratio (ref 0.0–0.9)

## 2017-03-10 LAB — ANA W/REFLEX: Anti Nuclear Antibody(ANA): NEGATIVE

## 2017-03-10 LAB — VITAMIN B12: Vitamin B-12: 492 pg/mL (ref 232–1245)

## 2017-03-10 LAB — TSH: TSH: 1.03 u[IU]/mL (ref 0.450–4.500)

## 2017-03-10 LAB — VITAMIN D 25 HYDROXY (VIT D DEFICIENCY, FRACTURES): Vit D, 25-Hydroxy: 20.4 ng/mL — ABNORMAL LOW (ref 30.0–100.0)

## 2017-03-15 ENCOUNTER — Other Ambulatory Visit: Payer: Medicare HMO

## 2017-03-27 ENCOUNTER — Other Ambulatory Visit: Payer: Medicare HMO

## 2017-03-29 ENCOUNTER — Ambulatory Visit: Payer: Medicare HMO | Admitting: Infectious Diseases

## 2017-04-06 ENCOUNTER — Other Ambulatory Visit: Payer: Self-pay | Admitting: Infectious Diseases

## 2017-04-06 DIAGNOSIS — B20 Human immunodeficiency virus [HIV] disease: Secondary | ICD-10-CM

## 2017-04-06 DIAGNOSIS — Z21 Asymptomatic human immunodeficiency virus [HIV] infection status: Secondary | ICD-10-CM

## 2017-04-09 ENCOUNTER — Ambulatory Visit
Admission: RE | Admit: 2017-04-09 | Discharge: 2017-04-09 | Disposition: A | Discharge status: Home / Self Care | Payer: Medicare HMO | Source: Ambulatory Visit | Attending: Neurology | Admitting: Neurology

## 2017-04-09 DIAGNOSIS — E118 Type 2 diabetes mellitus with unspecified complications: Secondary | ICD-10-CM

## 2017-04-09 DIAGNOSIS — Z21 Asymptomatic human immunodeficiency virus [HIV] infection status: Secondary | ICD-10-CM

## 2017-04-09 DIAGNOSIS — R52 Pain, unspecified: Secondary | ICD-10-CM

## 2017-04-09 DIAGNOSIS — I6359 Cerebral infarction due to unspecified occlusion or stenosis of other cerebral artery: Secondary | ICD-10-CM

## 2017-04-12 ENCOUNTER — Telehealth: Payer: Self-pay | Admitting: Neurology

## 2017-04-12 ENCOUNTER — Encounter: Payer: Self-pay | Admitting: Infectious Diseases

## 2017-04-12 ENCOUNTER — Ambulatory Visit (INDEPENDENT_AMBULATORY_CARE_PROVIDER_SITE_OTHER): Payer: Medicare HMO | Admitting: Infectious Diseases

## 2017-04-12 VITALS — BP 167/106 | HR 114 | Temp 97.8°F | Wt 198.0 lb

## 2017-04-12 DIAGNOSIS — B20 Human immunodeficiency virus [HIV] disease: Secondary | ICD-10-CM

## 2017-04-12 DIAGNOSIS — I6359 Cerebral infarction due to unspecified occlusion or stenosis of other cerebral artery: Secondary | ICD-10-CM

## 2017-04-12 DIAGNOSIS — Z113 Encounter for screening for infections with a predominantly sexual mode of transmission: Secondary | ICD-10-CM | POA: Diagnosis not present

## 2017-04-12 DIAGNOSIS — E118 Type 2 diabetes mellitus with unspecified complications: Secondary | ICD-10-CM

## 2017-04-12 DIAGNOSIS — Z720 Tobacco use: Secondary | ICD-10-CM | POA: Diagnosis not present

## 2017-04-12 DIAGNOSIS — Z21 Asymptomatic human immunodeficiency virus [HIV] infection status: Secondary | ICD-10-CM

## 2017-04-12 DIAGNOSIS — I10 Essential (primary) hypertension: Secondary | ICD-10-CM | POA: Insufficient documentation

## 2017-04-12 DIAGNOSIS — Z23 Encounter for immunization: Secondary | ICD-10-CM

## 2017-04-12 DIAGNOSIS — I679 Cerebrovascular disease, unspecified: Secondary | ICD-10-CM

## 2017-04-12 DIAGNOSIS — R52 Pain, unspecified: Secondary | ICD-10-CM | POA: Diagnosis not present

## 2017-04-12 MED ORDER — ABACAVIR-DOLUTEGRAVIR-LAMIVUD 600-50-300 MG PO TABS: 1.0000 | ORAL_TABLET | Freq: Every day | ORAL | 3 refills | Status: DC

## 2017-04-12 NOTE — Telephone Encounter (Signed)
MRI of the brain showed evidence of chronic lacunar infarction in the right pons, supratentorium small vessel disease no acute abnormality, I will review MRI films with him at next follow-up visit.  I have ordered ECHO, US carotid to complete vascular evaluations.  IMPRESSION:  This MRI of the brain without contrast shows the following: 1.   Chronic lacunar infarction in the right pons. 2.    T2/FLAIR hyperintense foci in the hemispheres and left thalamus consistent with moderate chronic microvascular ischemic change. 3.    There are no acute findings.

## 2017-04-12 NOTE — Telephone Encounter (Signed)
Spoke to patient - he is aware of MRI results.  He is agreeable to having ECHO and US Carotid.  He is aware to expect a call for scheduling.  He will keep his pending appt in December with Dr. Krista Blue.

## 2017-04-12 NOTE — Progress Notes (Signed)
   Subjective:    Patient ID: Donald Ross, male    DOB: 05/26/1961, 56 y.o.   MRN: [id number]  HPI 56 yo M with hx of HIV+ since June 2005. Has been undetectable the entire time by his account.  Was prev on Kaletra (gave him DM), Truvada, Atazanvir.  Was been on [place]ent[place]/epzicom for 5-6 years. Changed to triumeq 07-2016.  As well as DM2 since 2005, HTN, hyperlipidemia as well as CVA 09-2015. Has paraplegia resulting from this and numbness on half of his body. Has diabetic neuropathy as well.  He had neuro f/u last week, is to have CV w/u as well. His MRI of head did not show progression. He has f/u in December.  He continues to have pain. Has never been to pain clinic. Is taking tramadol, hydrocodone 5mg  prn.   Does not check his FSG. Has not felt sx of hyperglycemia.   Has not had colon.   HIV 1 RNA Quant (copies/mL)  Date Value  08/05/2016 <20 NOT DETECTED  04/07/2016 46 (H)   CD4 T Cell Abs (/uL)  Date Value  08/05/2016 1,710  04/07/2016 1,540    Review of Systems  Constitutional: Negative for appetite change, chills, fever and unexpected weight change.  Eyes: Positive for visual disturbance.  Gastrointestinal: Negative for constipation and diarrhea.  Endocrine: Negative for polyuria.  Genitourinary: Negative for difficulty urinating.  Neurological: Positive for weakness, numbness and headaches.  Psychiatric/Behavioral: Positive for dysphoric mood and sleep disturbance.  Erectile dysfunction.  Has spots in front of his eyes.     Objective:   Physical Exam  Constitutional: He appears well-developed and well-nourished.  HENT:  Mouth/Throat: No oropharyngeal exudate.  Eyes: Pupils are equal, round, and reactive to light. EOM are normal.  Neck: Neck supple.  Cardiovascular: Normal rate, regular rhythm and normal heart sounds.   Lymphadenopathy:    He has no cervical adenopathy.  Vitals reviewed.         Assessment & Plan:

## 2017-04-12 NOTE — Assessment & Plan Note (Signed)
Has taken his losarten this AM.  Will f/u with PCP.  Repeat 140/92

## 2017-04-12 NOTE — Assessment & Plan Note (Signed)
Encouraged to quit. 

## 2017-04-12 NOTE — Assessment & Plan Note (Signed)
Will refer him to pain clinic.

## 2017-04-12 NOTE — Assessment & Plan Note (Addendum)
Appreciate PCP f/u.  Will defer to them to check A1C Will refer him to ophtho

## 2017-04-12 NOTE — Addendum Note (Signed)
Addended by: Dolan Amen D on: 04/12/2017 03:22 PM   Modules accepted: Orders

## 2017-04-12 NOTE — Assessment & Plan Note (Signed)
Appreciate Neuro f/u.  Await further w/u.

## 2017-04-13 LAB — CBC
HEMATOCRIT: 44.6 % (ref 38.5–50.0)
HEMOGLOBIN: 14.8 g/dL (ref 13.2–17.1)
MCH: 28.7 pg (ref 27.0–33.0)
MCHC: 33.2 g/dL (ref 32.0–36.0)
MCV: 86.6 fL (ref 80.0–100.0)
MPV: 10.5 fL (ref 7.5–12.5)
Platelets: 374 10*3/uL (ref 140–400)
RBC: 5.15 10*6/uL (ref 4.20–5.80)
RDW: 14.8 % (ref 11.0–15.0)
WBC: 9.2 10*3/uL (ref 3.8–10.8)

## 2017-04-13 LAB — T-HELPER CELL (CD4) - (RCID CLINIC ONLY)
CD4 T CELL HELPER: 45 % (ref 33–55)
CD4 T Cell Abs: 1850 /uL (ref 400–2700)

## 2017-04-13 LAB — LIPID PANEL
CHOL/HDL RATIO: 3.1 (calc) (ref <5.0)
CHOLESTEROL: 154 mg/dL (ref <200)
HDL: 49 mg/dL (ref >40)
LDL CHOLESTEROL (CALC): 76 mg/dL
NON-HDL CHOLESTEROL (CALC): 105 mg/dL (ref <130)
TRIGLYCERIDES: 194 mg/dL — AB (ref <150)

## 2017-04-13 LAB — COMPREHENSIVE METABOLIC PANEL
AG RATIO: 1.6 (calc) (ref 1.0–2.5)
ALBUMIN MSPROF: 4.5 g/dL (ref 3.6–5.1)
ALT: 14 U/L (ref 9–46)
AST: 18 U/L (ref 10–35)
Alkaline phosphatase (APISO): 69 U/L (ref 40–115)
BILIRUBIN TOTAL: 0.2 mg/dL (ref 0.2–1.2)
BUN / CREAT RATIO: 13 (calc) (ref 6–22)
BUN: 25 mg/dL (ref 7–25)
CHLORIDE: 106 mmol/L (ref 98–110)
CO2: 23 mmol/L (ref 20–32)
Calcium: 10.6 mg/dL — ABNORMAL HIGH (ref 8.6–10.3)
Creat: 1.96 mg/dL — ABNORMAL HIGH (ref 0.70–1.33)
GLUCOSE: 134 mg/dL — AB (ref 65–99)
Globulin: 2.9 g/dL (calc) (ref 1.9–3.7)
Potassium: 5.2 mmol/L (ref 3.5–5.3)
SODIUM: 139 mmol/L (ref 135–146)
TOTAL PROTEIN: 7.4 g/dL (ref 6.1–8.1)

## 2017-04-13 LAB — HEMOGLOBIN A1C
EAG (MMOL/L): 8.7 (calc)
HEMOGLOBIN A1C: 7.1 %{Hb} — AB (ref <5.7)
MEAN PLASMA GLUCOSE: 157 (calc)

## 2017-04-13 LAB — RPR: RPR Ser Ql: NONREACTIVE

## 2017-04-14 LAB — HIV-1 RNA QUANT-NO REFLEX-BLD
HIV 1 RNA QUANT: NOT DETECTED {copies}/mL
HIV-1 RNA QUANT, LOG: NOT DETECTED {Log_copies}/mL

## 2017-04-20 NOTE — Progress Notes (Deleted)
[facility] Note  04/21/2017     CHIEF COMPLAINT Patient presents for No chief complaint on file.   HISTORY OF PRESENT ILLNESS: Donald Ross is a 56 y.o. male who presents to the clinic today for:     Referring physician: Campbell Riches, MD [place], [place] [address]  HISTORICAL INFORMATION:   Selected notes from the MEDICAL RECORD NUMBER Referral from Dr. Page Spiro for DM Exam; Ocular Hx-  PMH- Type 2 DM; HIV+; HTN; paraplegia; hyperlipidemia   CURRENT MEDICATIONS: No current outpatient prescriptions on file. (Ophthalmic Drugs)   No current facility-administered medications for this visit.  (Ophthalmic Drugs)   Current Outpatient Prescriptions (Other)  Medication Sig   abacavir-dolutegravir-lamiVUDine (TRIUMEQ) 600-50-300 MG tablet Take 1 tablet by mouth daily.   aspirin EC 81 MG tablet Take 1 tablet (81 mg total) by mouth daily.   Cholecalciferol (VITAMIN D3) 1000 units CAPS Take 1,000 Units by mouth daily.   cyclobenzaprine (FLEXERIL) 10 MG tablet Take 10 mg by mouth at bedtime.   fenofibrate micronized (LOFIBRA) 134 MG capsule Take 134 mg by mouth daily before breakfast.   FLUoxetine (PROZAC) 20 MG capsule Take 20 mg by mouth daily.   gabapentin (NEURONTIN) 800 MG tablet Take 800 mg by mouth 3 (three) times daily.   HYDROcodone-acetaminophen (NORCO/VICODIN) 5-325 MG tablet    Insulin Glargine (LANTUS SOLOSTAR) 100 UNIT/ML Solostar Pen Inject into the skin.   losartan-hydrochlorothiazide (HYZAAR) 50-12.5 MG tablet Take 1 tablet by mouth daily.   metFORMIN (GLUCOPHAGE) 1000 MG tablet Take 1,000 mg by mouth 2 (two) times daily with a meal.   nortriptyline (PAMELOR) 25 MG capsule Take 2 capsules (50 mg total) by mouth at bedtime. (Patient not taking: Reported on 04/12/2017)   omeprazole (PRILOSEC) 40 MG capsule Take 40 mg by mouth daily.   sitaGLIPtin (JANUVIA) 50 MG tablet Take 50 mg by mouth.    traMADol (ULTRAM) 50 MG tablet Take by mouth 2 (two) times daily.   Current Facility-Administered Medications (Other)  Medication Route   0.9 %  sodium chloride infusion Intravenous      REVIEW OF SYSTEMS:    ALLERGIES Allergies  Allergen Reactions   Clonazepam Swelling    PAST MEDICAL HISTORY Past Medical History:  Diagnosis Date   Allergy    Anxiety    Depression    Diabetes mellitus without complication (HCC)    GERD (gastroesophageal reflux disease)    HIV infection ([place])    Hypertension    Kidney stones 2016   Numbness    Pain    Stroke ([place]) 10/07/2015   Past Surgical History:  Procedure Laterality Date   EYE SURGERY Bilateral 1968   LITHOTRIPSY  2016   TENDON REPAIR Left 2016   thumb    FAMILY HISTORY Family History  Problem Relation Age of Onset   CVA Mother        3 strokes   Alzheimer's disease Mother    Colon cancer Neg Hx     SOCIAL HISTORY Social History  Substance Use Topics   Smoking status: Current Every Day Smoker    Packs/day: 0.50    Years: 39.00    Types: Cigarettes    Start date: 04/03/1977   Smokeless tobacco: Never Used   Alcohol use No         OPHTHALMIC EXAM:   Not recorded      IMAGING AND PROCEDURES  Imaging and Procedures for 04/20/17  ASSESSMENT/PLAN:    ICD-10-CM   1. Retinal edema H35.81 OCT, Retina - OU - Both Eyes    1.  2.  3.  Ophthalmic Meds Ordered this visit:  No orders of the defined types were placed in this encounter.      No Follow-up on file.  There are no Patient Instructions on file for this visit.   Explained the diagnoses, plan, and follow up with the patient and they expressed understanding.  Patient expressed understanding of the importance of proper follow up care.   Gardiner Sleeper, M.D., Ph.D. Diseases & Surgery of the Retina and [place] 04/20/17     Abbreviations: M myopia  (nearsighted); A astigmatism; H hyperopia (farsighted); P presbyopia; Mrx spectacle prescription;  CTL contact lenses; OD right eye; OS left eye; OU both eyes  XT exotropia; ET esotropia; PEK punctate epithelial keratitis; PEE punctate epithelial erosions; DES dry eye syndrome; MGD meibomian gland dysfunction; ATs artificial tears; PFAT's preservative free artificial tears; [place] nuclear sclerotic cataract; PSC posterior subcapsular cataract; ERM epi-retinal membrane; PVD posterior vitreous detachment; RD retinal detachment; DM diabetes mellitus; DR diabetic retinopathy; NPDR non-proliferative diabetic retinopathy; PDR proliferative diabetic retinopathy; CSME clinically significant macular edema; DME diabetic macular edema; dbh dot blot hemorrhages; CWS cotton wool spot; POAG primary open angle glaucoma; C/D cup-to-disc ratio; HVF humphrey visual field; GVF goldmann visual field; OCT optical coherence tomography; IOP intraocular pressure; BRVO Branch retinal vein occlusion; CRVO central retinal vein occlusion; CRAO central retinal artery occlusion; BRAO branch retinal artery occlusion; RT retinal tear; SB scleral buckle; PPV pars plana vitrectomy; VH Vitreous hemorrhage; PRP panretinal laser photocoagulation; IVK intravitreal kenalog; VMT vitreomacular traction; MH Macular hole;  NVD neovascularization of the disc; NVE neovascularization elsewhere; AREDS age related eye disease study; ARMD age related macular degeneration; POAG primary open angle glaucoma; EBMD epithelial/anterior basement membrane dystrophy; ACIOL anterior chamber intraocular lens; IOL intraocular lens; PCIOL posterior chamber intraocular lens; Phaco/IOL phacoemulsification with intraocular lens placement; [place] photorefractive keratectomy; LASIK laser assisted in situ keratomileusis; HTN hypertension; DM diabetes mellitus; COPD chronic obstructive pulmonary disease

## 2017-04-21 ENCOUNTER — Encounter (INDEPENDENT_AMBULATORY_CARE_PROVIDER_SITE_OTHER): Payer: Medicare HMO | Admitting: Ophthalmology

## 2017-04-23 NOTE — Progress Notes (Deleted)
[facility] Note  04/26/2017     CHIEF COMPLAINT Patient presents for No chief complaint on file.   HISTORY OF PRESENT ILLNESS: Donald Ross is a 56 y.o. male who presents to the clinic today for:     Referring physician: Campbell Riches, MD [place], [place] [address]  HISTORICAL INFORMATION:   Selected notes from the MEDICAL RECORD NUMBER Referral from Dr. Page Spiro for DM Exam; Ocular Hx-  PMH- Type 2 DM; HIV+; HTN; paraplegia; hyperlipidemia   CURRENT MEDICATIONS: No current outpatient prescriptions on file. (Ophthalmic Drugs)   No current facility-administered medications for this visit.  (Ophthalmic Drugs)   Current Outpatient Prescriptions (Other)  Medication Sig   abacavir-dolutegravir-lamiVUDine (TRIUMEQ) 600-50-300 MG tablet Take 1 tablet by mouth daily.   aspirin EC 81 MG tablet Take 1 tablet (81 mg total) by mouth daily.   Cholecalciferol (VITAMIN D3) 1000 units CAPS Take 1,000 Units by mouth daily.   cyclobenzaprine (FLEXERIL) 10 MG tablet Take 10 mg by mouth at bedtime.   fenofibrate micronized (LOFIBRA) 134 MG capsule Take 134 mg by mouth daily before breakfast.   FLUoxetine (PROZAC) 20 MG capsule Take 20 mg by mouth daily.   gabapentin (NEURONTIN) 800 MG tablet Take 800 mg by mouth 3 (three) times daily.   HYDROcodone-acetaminophen (NORCO/VICODIN) 5-325 MG tablet    Insulin Glargine (LANTUS SOLOSTAR) 100 UNIT/ML Solostar Pen Inject into the skin.   losartan-hydrochlorothiazide (HYZAAR) 50-12.5 MG tablet Take 1 tablet by mouth daily.   metFORMIN (GLUCOPHAGE) 1000 MG tablet Take 1,000 mg by mouth 2 (two) times daily with a meal.   nortriptyline (PAMELOR) 25 MG capsule Take 2 capsules (50 mg total) by mouth at bedtime. (Patient not taking: Reported on 04/12/2017)   omeprazole (PRILOSEC) 40 MG capsule Take 40 mg by mouth daily.   sitaGLIPtin (JANUVIA) 50 MG tablet Take 50 mg by mouth.    traMADol (ULTRAM) 50 MG tablet Take by mouth 2 (two) times daily.   Current Facility-Administered Medications (Other)  Medication Route   0.9 %  sodium chloride infusion Intravenous      REVIEW OF SYSTEMS:    ALLERGIES Allergies  Allergen Reactions   Clonazepam Swelling    PAST MEDICAL HISTORY Past Medical History:  Diagnosis Date   Allergy    Anxiety    Depression    Diabetes mellitus without complication (H[facility])    GERD (gastroesophageal reflux disease)    HIV infection ([place])    Hypertension    Kidney stones 2016   Numbness    Pain    Stroke ([place]) 10/07/2015   Past Surgical History:  Procedure Laterality Date   EYE SURGERY Bilateral 1968   LITHOTRIPSY  2016   TENDON REPAIR Left 2016   thumb    FAMILY HISTORY Family History  Problem Relation Age of Onset   CVA Mother        3 strokes   Alzheimer's disease Mother    Colon cancer Neg Hx     SOCIAL HISTORY Social History  Substance Use Topics   Smoking status: Current Every Day Smoker    Packs/day: 0.50    Years: 39.00    Types: Cigarettes    Start date: 04/03/1977   Smokeless tobacco: Never Used   Alcohol use No         OPHTHALMIC EXAM:   Not recorded      IMAGING AND PROCEDURES  Imaging and Procedures for 04/23/17  ASSESSMENT/PLAN:    ICD-10-CM   1. Retinal edema H35.81 OCT, Retina - OU - Both Eyes    1.  2.  3.  Ophthalmic Meds Ordered this visit:  No orders of the defined types were placed in this encounter.      No Follow-up on file.  There are no Patient Instructions on file for this visit.   Explained the diagnoses, plan, and follow up with the patient and they expressed understanding.  Patient expressed understanding of the importance of proper follow up care.   Gardiner Sleeper, M.D., Ph.D. Diseases & Surgery of the Retina and Vitreous [facility] 04/23/17     Abbreviations: M myopia  (nearsighted); A astigmatism; H hyperopia (farsighted); P presbyopia; Mrx spectacle prescription;  CTL contact lenses; OD right eye; OS left eye; OU both eyes  XT exotropia; ET esotropia; PEK punctate epithelial keratitis; PEE punctate epithelial erosions; DES dry eye syndrome; MGD meibomian gland dysfunction; ATs artificial tears; PFAT's preservative free artificial tears; [place] nuclear sclerotic cataract; PSC posterior subcapsular cataract; ERM epi-retinal membrane; PVD posterior vitreous detachment; RD retinal detachment; DM diabetes mellitus; DR diabetic retinopathy; NPDR non-proliferative diabetic retinopathy; PDR proliferative diabetic retinopathy; CSME clinically significant macular edema; DME diabetic macular edema; dbh dot blot hemorrhages; CWS cotton wool spot; POAG primary open angle glaucoma; C/D cup-to-disc ratio; HVF humphrey visual field; GVF goldmann visual field; OCT optical coherence tomography; IOP intraocular pressure; BRVO Branch retinal vein occlusion; CRVO central retinal vein occlusion; CRAO central retinal artery occlusion; BRAO branch retinal artery occlusion; RT retinal tear; SB scleral buckle; PPV pars plana vitrectomy; VH Vitreous hemorrhage; PRP panretinal laser photocoagulation; IVK intravitreal kenalog; VMT vitreomacular traction; MH Macular hole;  NVD neovascularization of the disc; NVE neovascularization elsewhere; AREDS age related eye disease study; ARMD age related macular degeneration; POAG primary open angle glaucoma; EBMD epithelial/anterior basement membrane dystrophy; ACIOL anterior chamber intraocular lens; IOL intraocular lens; PCIOL posterior chamber intraocular lens; Phaco/IOL phacoemulsification with intraocular lens placement; [place] photorefractive keratectomy; LASIK laser assisted in situ keratomileusis; HTN hypertension; DM diabetes mellitus; COPD chronic obstructive pulmonary disease

## 2017-04-26 ENCOUNTER — Encounter (INDEPENDENT_AMBULATORY_CARE_PROVIDER_SITE_OTHER): Payer: Medicare HMO | Admitting: Ophthalmology

## 2017-04-26 NOTE — Progress Notes (Deleted)
[facility] Note  04/27/2017     CHIEF COMPLAINT Patient presents for No chief complaint on file.   HISTORY OF PRESENT ILLNESS: Donald Ross is a 56 y.o. male who presents to the clinic today for:     Referring physician: Campbell Riches, MD [place], [place] [address]  HISTORICAL INFORMATION:   Selected notes from the MEDICAL RECORD NUMBER Referral from Dr. Page Spiro for DM Exam; Ocular Hx-  PMH- Type 2 DM; HIV+; HTN; paraplegia; hyperlipidemia; Last A1C- 7.1;    CURRENT MEDICATIONS: No current outpatient prescriptions on file. (Ophthalmic Drugs)   No current facility-administered medications for this visit.  (Ophthalmic Drugs)   Current Outpatient Prescriptions (Other)  Medication Sig   abacavir-dolutegravir-lamiVUDine (TRIUMEQ) 600-50-300 MG tablet Take 1 tablet by mouth daily.   aspirin EC 81 MG tablet Take 1 tablet (81 mg total) by mouth daily.   Cholecalciferol (VITAMIN D3) 1000 units CAPS Take 1,000 Units by mouth daily.   cyclobenzaprine (FLEXERIL) 10 MG tablet Take 10 mg by mouth at bedtime.   fenofibrate micronized (LOFIBRA) 134 MG capsule Take 134 mg by mouth daily before breakfast.   FLUoxetine (PROZAC) 20 MG capsule Take 20 mg by mouth daily.   gabapentin (NEURONTIN) 800 MG tablet Take 800 mg by mouth 3 (three) times daily.   HYDROcodone-acetaminophen (NORCO/VICODIN) 5-325 MG tablet    Insulin Glargine (LANTUS SOLOSTAR) 100 UNIT/ML Solostar Pen Inject into the skin.   losartan-hydrochlorothiazide (HYZAAR) 50-12.5 MG tablet Take 1 tablet by mouth daily.   metFORMIN (GLUCOPHAGE) 1000 MG tablet Take 1,000 mg by mouth 2 (two) times daily with a meal.   nortriptyline (PAMELOR) 25 MG capsule Take 2 capsules (50 mg total) by mouth at bedtime. (Patient not taking: Reported on 04/12/2017)   omeprazole (PRILOSEC) 40 MG capsule Take 40 mg by mouth daily.   sitaGLIPtin (JANUVIA) 50 MG tablet Take 50  mg by mouth.   traMADol (ULTRAM) 50 MG tablet Take by mouth 2 (two) times daily.   Current Facility-Administered Medications (Other)  Medication Route   0.9 %  sodium chloride infusion Intravenous      REVIEW OF SYSTEMS:    ALLERGIES Allergies  Allergen Reactions   Clonazepam Swelling    PAST MEDICAL HISTORY Past Medical History:  Diagnosis Date   Allergy    Anxiety    Depression    Diabetes mellitus without complication (HCC)    GERD (gastroesophageal reflux disease)    HIV infection ([place])    Hypertension    Kidney stones 2016   Numbness    Pain    Stroke ([place]) 10/07/2015   Past Surgical History:  Procedure Laterality Date   EYE SURGERY Bilateral 1968   LITHOTRIPSY  2016   TENDON REPAIR Left 2016   thumb    FAMILY HISTORY Family History  Problem Relation Age of Onset   CVA Mother        3 strokes   Alzheimer's disease Mother    Colon cancer Neg Hx     SOCIAL HISTORY Social History  Substance Use Topics   Smoking status: Current Every Day Smoker    Packs/day: 0.50    Years: 39.00    Types: Cigarettes    Start date: 04/03/1977   Smokeless tobacco: Never Used   Alcohol use No         OPHTHALMIC EXAM:   Not recorded      IMAGING AND PROCEDURES  Imaging and  Procedures for 04/26/17           ASSESSMENT/PLAN:    ICD-10-CM   1. Retinal edema H35.81 OCT, Retina - OU - Both Eyes    1.  2.  3.  Ophthalmic Meds Ordered this visit:  No orders of the defined types were placed in this encounter.      No Follow-up on file.  There are no Patient Instructions on file for this visit.   Explained the diagnoses, plan, and follow up with the patient and they expressed understanding.  Patient expressed understanding of the importance of proper follow up care.   Gardiner Sleeper, M.D., Ph.D. Diseases & Surgery of the Retina and Vitreous [facility] 04/26/17     Abbreviations: M  myopia (nearsighted); A astigmatism; H hyperopia (farsighted); P presbyopia; Mrx spectacle prescription;  CTL contact lenses; OD right eye; OS left eye; OU both eyes  XT exotropia; ET esotropia; PEK punctate epithelial keratitis; PEE punctate epithelial erosions; DES dry eye syndrome; MGD meibomian gland dysfunction; ATs artificial tears; PFAT's preservative free artificial tears; [place] nuclear sclerotic cataract; PSC posterior subcapsular cataract; ERM epi-retinal membrane; PVD posterior vitreous detachment; RD retinal detachment; DM diabetes mellitus; DR diabetic retinopathy; NPDR non-proliferative diabetic retinopathy; PDR proliferative diabetic retinopathy; CSME clinically significant macular edema; DME diabetic macular edema; dbh dot blot hemorrhages; CWS cotton wool spot; POAG primary open angle glaucoma; C/D cup-to-disc ratio; HVF humphrey visual field; GVF goldmann visual field; OCT optical coherence tomography; IOP intraocular pressure; BRVO Branch retinal vein occlusion; CRVO central retinal vein occlusion; CRAO central retinal artery occlusion; BRAO branch retinal artery occlusion; RT retinal tear; SB scleral buckle; PPV pars plana vitrectomy; VH Vitreous hemorrhage; PRP panretinal laser photocoagulation; IVK intravitreal kenalog; VMT vitreomacular traction; MH Macular hole;  NVD neovascularization of the disc; NVE neovascularization elsewhere; AREDS age related eye disease study; ARMD age related macular degeneration; POAG primary open angle glaucoma; EBMD epithelial/anterior basement membrane dystrophy; ACIOL anterior chamber intraocular lens; IOL intraocular lens; PCIOL posterior chamber intraocular lens; Phaco/IOL phacoemulsification with intraocular lens placement; [place] photorefractive keratectomy; LASIK laser assisted in situ keratomileusis; HTN hypertension; DM diabetes mellitus; COPD chronic obstructive pulmonary disease

## 2017-04-27 ENCOUNTER — Encounter (INDEPENDENT_AMBULATORY_CARE_PROVIDER_SITE_OTHER): Payer: Medicare HMO | Admitting: Ophthalmology

## 2017-04-28 ENCOUNTER — Emergency Department (HOSPITAL_COMMUNITY)
Admission: EM | Admit: 2017-04-28 | Discharge: 2017-04-28 | Discharge status: Against Medical Advice | Payer: Medicare HMO

## 2017-04-28 NOTE — ED Notes (Signed)
Called Pt three times. No answer from lobby

## 2017-05-03 ENCOUNTER — Encounter: Payer: Self-pay | Admitting: Physical Medicine & Rehabilitation

## 2017-05-26 ENCOUNTER — Encounter: Payer: Medicare HMO | Attending: Physical Medicine & Rehabilitation | Admitting: Physical Medicine & Rehabilitation

## 2017-06-07 ENCOUNTER — Telehealth: Payer: Self-pay | Admitting: *Deleted

## 2017-06-07 NOTE — Telephone Encounter (Signed)
Patient's phone is not accepting calls at this time.  Reached out to [employer] who is listed as his emergency contact.  She is aware that our office is closed and his appt will need to be rescheduled.  She will get in touch with him to let him know.  Provided our number to call back to schedule a new follow up time with Dr. Krista Blue.

## 2017-06-08 ENCOUNTER — Ambulatory Visit: Payer: Medicare HMO | Admitting: Neurology

## 2017-08-17 ENCOUNTER — Emergency Department (HOSPITAL_COMMUNITY)
Admission: EM | Admit: 2017-08-17 | Discharge: 2017-08-17 | Disposition: A | Discharge status: Home / Self Care | Payer: Medicare HMO | Attending: Emergency Medicine | Admitting: Emergency Medicine

## 2017-08-17 ENCOUNTER — Emergency Department (HOSPITAL_COMMUNITY): Payer: Medicare HMO

## 2017-08-17 ENCOUNTER — Other Ambulatory Visit: Payer: Self-pay

## 2017-08-17 ENCOUNTER — Encounter (HOSPITAL_COMMUNITY): Payer: Self-pay

## 2017-08-17 DIAGNOSIS — Y9289 Other specified places as the place of occurrence of the external cause: Secondary | ICD-10-CM | POA: Diagnosis not present

## 2017-08-17 DIAGNOSIS — Z794 Long term (current) use of insulin: Secondary | ICD-10-CM | POA: Insufficient documentation

## 2017-08-17 DIAGNOSIS — B20 Human immunodeficiency virus [HIV] disease: Secondary | ICD-10-CM | POA: Insufficient documentation

## 2017-08-17 DIAGNOSIS — E1140 Type 2 diabetes mellitus with diabetic neuropathy, unspecified: Secondary | ICD-10-CM | POA: Insufficient documentation

## 2017-08-17 DIAGNOSIS — S20212A Contusion of left front wall of thorax, initial encounter: Secondary | ICD-10-CM

## 2017-08-17 DIAGNOSIS — S0181XA Laceration without foreign body of other part of head, initial encounter: Secondary | ICD-10-CM | POA: Diagnosis not present

## 2017-08-17 DIAGNOSIS — F17210 Nicotine dependence, cigarettes, uncomplicated: Secondary | ICD-10-CM | POA: Insufficient documentation

## 2017-08-17 DIAGNOSIS — Z23 Encounter for immunization: Secondary | ICD-10-CM | POA: Insufficient documentation

## 2017-08-17 DIAGNOSIS — Z7982 Long term (current) use of aspirin: Secondary | ICD-10-CM | POA: Insufficient documentation

## 2017-08-17 DIAGNOSIS — Y999 Unspecified external cause status: Secondary | ICD-10-CM | POA: Diagnosis not present

## 2017-08-17 DIAGNOSIS — S0990XA Unspecified injury of head, initial encounter: Secondary | ICD-10-CM | POA: Diagnosis present

## 2017-08-17 DIAGNOSIS — S2020XA Contusion of thorax, unspecified, initial encounter: Secondary | ICD-10-CM | POA: Diagnosis not present

## 2017-08-17 DIAGNOSIS — Y939 Activity, unspecified: Secondary | ICD-10-CM | POA: Diagnosis not present

## 2017-08-17 LAB — I-STAT CHEM 8, ED
BUN: 21 mg/dL — AB (ref 6–20)
CREATININE: 1.4 mg/dL — AB (ref 0.61–1.24)
Calcium, Ion: 1.12 mmol/L — ABNORMAL LOW (ref 1.15–1.40)
Chloride: 102 mmol/L (ref 101–111)
Glucose, Bld: 231 mg/dL — ABNORMAL HIGH (ref 65–99)
HCT: 41 % (ref 39.0–52.0)
HEMOGLOBIN: 13.9 g/dL (ref 13.0–17.0)
POTASSIUM: 3.8 mmol/L (ref 3.5–5.1)
Sodium: 137 mmol/L (ref 135–145)
TCO2: 25 mmol/L (ref 22–32)

## 2017-08-17 LAB — COMPREHENSIVE METABOLIC PANEL
ALBUMIN: 3.4 g/dL — AB (ref 3.5–5.0)
ALT: 15 U/L — ABNORMAL LOW (ref 17–63)
AST: 21 U/L (ref 15–41)
Alkaline Phosphatase: 77 U/L (ref 38–126)
Anion gap: 12 (ref 5–15)
BILIRUBIN TOTAL: 0.4 mg/dL (ref 0.3–1.2)
BUN: 19 mg/dL (ref 6–20)
CALCIUM: 9.3 mg/dL (ref 8.9–10.3)
CO2: 22 mmol/L (ref 22–32)
Chloride: 103 mmol/L (ref 101–111)
Creatinine, Ser: 1.5 mg/dL — ABNORMAL HIGH (ref 0.61–1.24)
GFR calc Af Amer: 59 mL/min — ABNORMAL LOW (ref >60)
GFR calc non Af Amer: 51 mL/min — ABNORMAL LOW (ref >60)
GLUCOSE: 238 mg/dL — AB (ref 65–99)
Potassium: 3.7 mmol/L (ref 3.5–5.1)
Sodium: 137 mmol/L (ref 135–145)
TOTAL PROTEIN: 7.7 g/dL (ref 6.5–8.1)

## 2017-08-17 LAB — CBC WITH DIFFERENTIAL/PLATELET
Basophils Absolute: 0 10*3/uL (ref 0.0–0.1)
Basophils Relative: 0 %
EOS ABS: 0.1 10*3/uL (ref 0.0–0.7)
EOS PCT: 1 %
HCT: 40.2 % (ref 39.0–52.0)
HEMOGLOBIN: 13.3 g/dL (ref 13.0–17.0)
LYMPHS ABS: 2.2 10*3/uL (ref 0.7–4.0)
Lymphocytes Relative: 23 %
MCH: 28.5 pg (ref 26.0–34.0)
MCHC: 33.1 g/dL (ref 30.0–36.0)
MCV: 86.3 fL (ref 78.0–100.0)
MONOS PCT: 6 %
Monocytes Absolute: 0.6 10*3/uL (ref 0.1–1.0)
NEUTROS PCT: 70 %
Neutro Abs: 6.6 10*3/uL (ref 1.7–7.7)
Platelets: 350 10*3/uL (ref 150–400)
RBC: 4.66 MIL/uL (ref 4.22–5.81)
RDW: 13.3 % (ref 11.5–15.5)
WBC: 9.5 10*3/uL (ref 4.0–10.5)

## 2017-08-17 LAB — RAPID URINE DRUG SCREEN, HOSP PERFORMED
AMPHETAMINES: NOT DETECTED
BARBITURATES: NOT DETECTED
BENZODIAZEPINES: NOT DETECTED
Cocaine: POSITIVE — AB
Opiates: NOT DETECTED
TETRAHYDROCANNABINOL: NOT DETECTED

## 2017-08-17 LAB — ETHANOL: Alcohol, Ethyl (B): <10 mg/dL (ref <10)

## 2017-08-17 LAB — I-STAT TROPONIN, ED: TROPONIN I, POC: 0.01 ng/mL (ref 0.00–0.08)

## 2017-08-17 LAB — LIPASE, BLOOD: Lipase: 75 U/L — ABNORMAL HIGH (ref 11–51)

## 2017-08-17 MED ORDER — METHOCARBAMOL 500 MG PO TABS: 500.0000 mg | ORAL_TABLET | Freq: Three times a day (TID) | ORAL | 0 refills | Status: DC | PRN

## 2017-08-17 MED ORDER — LIDOCAINE-EPINEPHRINE (PF) 2 %-1:200000 IJ SOLN
10.0000 mL | Freq: Once | INTRAMUSCULAR | Status: AC
  Administered 2017-08-17: 10 mL
  Filled 2017-08-17: qty 20

## 2017-08-17 MED ORDER — TETANUS-DIPHTH-ACELL PERTUSSIS 5-2.5-18.5 LF-MCG/0.5 IM SUSP
0.5000 mL | Freq: Once | INTRAMUSCULAR | Status: AC
  Administered 2017-08-17: 0.5 mL via INTRAMUSCULAR
  Filled 2017-08-17: qty 0.5

## 2017-08-17 MED ORDER — IOPAMIDOL (ISOVUE-300) INJECTION 61%
INTRAVENOUS | Status: AC
  Filled 2017-08-17: qty 100

## 2017-08-17 MED ORDER — LIDOCAINE HCL (PF) 1 % IJ SOLN: 5.0000 mL | Freq: Once | INTRAMUSCULAR | Status: DC

## 2017-08-17 MED ORDER — SODIUM CHLORIDE 0.9 % IV BOLUS (SEPSIS)
1000.0000 mL | Freq: Once | INTRAVENOUS | Status: AC
  Administered 2017-08-17: 1000 mL via INTRAVENOUS

## 2017-08-17 MED ORDER — FENTANYL CITRATE (PF) 100 MCG/2ML IJ SOLN
50.0000 ug | Freq: Once | INTRAMUSCULAR | Status: AC
  Administered 2017-08-17: 50 ug via INTRAVENOUS
  Filled 2017-08-17: qty 2

## 2017-08-17 MED ORDER — KETOROLAC TROMETHAMINE 30 MG/ML IJ SOLN
30.0000 mg | Freq: Once | INTRAMUSCULAR | Status: AC
  Administered 2017-08-17: 30 mg via INTRAVENOUS
  Filled 2017-08-17: qty 1

## 2017-08-17 MED ORDER — IOPAMIDOL (ISOVUE-300) INJECTION 61%
100.0000 mL | Freq: Once | INTRAVENOUS | Status: AC | PRN
  Administered 2017-08-17: 100 mL via INTRAVENOUS

## 2017-08-17 NOTE — ED Provider Notes (Signed)
..  Laceration Repair Date/Time: 08/17/2017 2:43 PM Performed by: Lorayne Bender, PA-C Authorized by: Lorayne Bender, PA-C   Consent:    Consent obtained:  Verbal   Consent given by:  Patient   Risks discussed:  Poor cosmetic result, poor wound healing, pain, need for additional repair and infection Anesthesia (see MAR for exact dosages):    Anesthesia method:  Local infiltration   Local anesthetic:  Lidocaine 2% WITH epi Laceration details:    Location:  Face   Face location:  L eyebrow   Length (cm):  2.5 Repair type:    Repair type:  Intermediate Exploration:    Hemostasis achieved with:  Epinephrine   Wound exploration: wound explored through full range of motion   Treatment:    Area cleansed with:  Betadine and saline   Amount of cleaning:  Standard   Irrigation solution:  Sterile saline   Irrigation method:  Syringe Skin repair:    Repair method:  Sutures   Suture size:  5-0   Suture material:  Prolene   Suture technique:  Simple interrupted   Number of sutures:  10 Approximation:    Approximation:  Close Post-procedure details:    Dressing:  Sterile dressing   Patient tolerance of procedure:  Tolerated well, no immediate complications .Marland KitchenLaceration Repair Date/Time: 08/17/2017 2:44 PM Performed by: Lorayne Bender, PA-C Authorized by: Lorayne Bender, PA-C   Consent:    Consent obtained:  Verbal   Consent given by:  Patient   Risks discussed:  Infection, need for additional repair, pain, poor cosmetic result and poor wound healing Anesthesia (see MAR for exact dosages):    Anesthesia method:  Local infiltration   Local anesthetic:  Lidocaine 2% WITH epi Laceration details:    Location:  Face   Face location:  Forehead   Length (cm):  1.5 Repair type:    Repair type:  Simple Pre-procedure details:    Preparation:  Patient was prepped and draped in usual sterile fashion Exploration:    Hemostasis achieved with:  Epinephrine   Wound exploration: wound explored  through full range of motion   Treatment:    Area cleansed with:  Betadine and saline   Amount of cleaning:  Standard   Irrigation solution:  Sterile saline   Irrigation method:  Syringe Skin repair:    Repair method:  Tissue adhesive Approximation:    Approximation:  Close Post-procedure details:    Dressing:  Open (no dressing)   Patient tolerance of procedure:  Tolerated well, no immediate complications       Layla Maw 08/17/17 1612    Davonna Belling, MD 08/17/17 1616
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
## 2017-08-17 NOTE — ED Notes (Signed)
Suture tray at bedside.  
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
## 2017-08-17 NOTE — Discharge Instructions (Signed)
Sutures out in 7 days

## 2017-10-11 ENCOUNTER — Inpatient Hospital Stay (HOSPITAL_COMMUNITY): Payer: Medicare HMO | Admitting: Certified Registered Nurse Anesthetist

## 2017-10-11 ENCOUNTER — Telehealth: Payer: Self-pay

## 2017-10-11 ENCOUNTER — Ambulatory Visit (INDEPENDENT_AMBULATORY_CARE_PROVIDER_SITE_OTHER): Payer: Medicare HMO | Admitting: Infectious Diseases

## 2017-10-11 ENCOUNTER — Encounter (HOSPITAL_COMMUNITY): Payer: Self-pay

## 2017-10-11 ENCOUNTER — Emergency Department (HOSPITAL_COMMUNITY): Payer: Medicare HMO

## 2017-10-11 ENCOUNTER — Encounter (HOSPITAL_COMMUNITY)
Admission: EM | Disposition: A | Discharge status: Home / Self Care | Payer: Self-pay | Source: Home / Self Care | Attending: Internal Medicine

## 2017-10-11 ENCOUNTER — Other Ambulatory Visit: Payer: Self-pay

## 2017-10-11 ENCOUNTER — Inpatient Hospital Stay (HOSPITAL_COMMUNITY)
Admission: EM | Admit: 2017-10-11 | Discharge: 2017-10-14 | DRG: 988 | Disposition: A | Discharge status: Home / Self Care | Payer: Medicare HMO | Attending: Internal Medicine | Admitting: Internal Medicine

## 2017-10-11 ENCOUNTER — Inpatient Hospital Stay (HOSPITAL_COMMUNITY): Payer: Medicare HMO

## 2017-10-11 ENCOUNTER — Encounter: Payer: Self-pay | Admitting: Infectious Diseases

## 2017-10-11 VITALS — BP 131/84 | HR 112 | Temp 97.9°F | Wt 167.0 lb

## 2017-10-11 DIAGNOSIS — B9561 Methicillin susceptible Staphylococcus aureus infection as the cause of diseases classified elsewhere: Secondary | ICD-10-CM | POA: Diagnosis not present

## 2017-10-11 DIAGNOSIS — Z79891 Long term (current) use of opiate analgesic: Secondary | ICD-10-CM

## 2017-10-11 DIAGNOSIS — R402413 Glasgow coma scale score 13-15, at hospital admission: Secondary | ICD-10-CM | POA: Diagnosis present

## 2017-10-11 DIAGNOSIS — W458XXA Other foreign body or object entering through skin, initial encounter: Secondary | ICD-10-CM | POA: Diagnosis present

## 2017-10-11 DIAGNOSIS — L02512 Cutaneous abscess of left hand: Secondary | ICD-10-CM | POA: Diagnosis present

## 2017-10-11 DIAGNOSIS — E785 Hyperlipidemia, unspecified: Secondary | ICD-10-CM | POA: Diagnosis present

## 2017-10-11 DIAGNOSIS — M86142 Other acute osteomyelitis, left hand: Secondary | ICD-10-CM | POA: Diagnosis not present

## 2017-10-11 DIAGNOSIS — B950 Streptococcus, group A, as the cause of diseases classified elsewhere: Secondary | ICD-10-CM | POA: Diagnosis not present

## 2017-10-11 DIAGNOSIS — Z8673 Personal history of transient ischemic attack (TIA), and cerebral infarction without residual deficits: Secondary | ICD-10-CM | POA: Diagnosis not present

## 2017-10-11 DIAGNOSIS — E118 Type 2 diabetes mellitus with unspecified complications: Secondary | ICD-10-CM

## 2017-10-11 DIAGNOSIS — E1340 Other specified diabetes mellitus with diabetic neuropathy, unspecified: Secondary | ICD-10-CM

## 2017-10-11 DIAGNOSIS — L02519 Cutaneous abscess of unspecified hand: Secondary | ICD-10-CM | POA: Diagnosis not present

## 2017-10-11 DIAGNOSIS — I10 Essential (primary) hypertension: Secondary | ICD-10-CM | POA: Diagnosis present

## 2017-10-11 DIAGNOSIS — N189 Chronic kidney disease, unspecified: Secondary | ICD-10-CM | POA: Diagnosis present

## 2017-10-11 DIAGNOSIS — N182 Chronic kidney disease, stage 2 (mild): Secondary | ICD-10-CM | POA: Diagnosis not present

## 2017-10-11 DIAGNOSIS — Z113 Encounter for screening for infections with a predominantly sexual mode of transmission: Secondary | ICD-10-CM | POA: Diagnosis not present

## 2017-10-11 DIAGNOSIS — E1122 Type 2 diabetes mellitus with diabetic chronic kidney disease: Secondary | ICD-10-CM | POA: Diagnosis present

## 2017-10-11 DIAGNOSIS — K219 Gastro-esophageal reflux disease without esophagitis: Secondary | ICD-10-CM | POA: Diagnosis present

## 2017-10-11 DIAGNOSIS — Z79899 Other long term (current) drug therapy: Secondary | ICD-10-CM

## 2017-10-11 DIAGNOSIS — F1910 Other psychoactive substance abuse, uncomplicated: Secondary | ICD-10-CM | POA: Diagnosis present

## 2017-10-11 DIAGNOSIS — S61412A Laceration without foreign body of left hand, initial encounter: Secondary | ICD-10-CM | POA: Diagnosis present

## 2017-10-11 DIAGNOSIS — N183 Chronic kidney disease, stage 3 (moderate): Secondary | ICD-10-CM | POA: Diagnosis present

## 2017-10-11 DIAGNOSIS — Z794 Long term (current) use of insulin: Secondary | ICD-10-CM

## 2017-10-11 DIAGNOSIS — Z823 Family history of stroke: Secondary | ICD-10-CM | POA: Diagnosis not present

## 2017-10-11 DIAGNOSIS — L089 Local infection of the skin and subcutaneous tissue, unspecified: Secondary | ICD-10-CM | POA: Diagnosis present

## 2017-10-11 DIAGNOSIS — E1169 Type 2 diabetes mellitus with other specified complication: Principal | ICD-10-CM | POA: Diagnosis present

## 2017-10-11 DIAGNOSIS — F1410 Cocaine abuse, uncomplicated: Secondary | ICD-10-CM | POA: Diagnosis present

## 2017-10-11 DIAGNOSIS — Z01818 Encounter for other preprocedural examination: Secondary | ICD-10-CM | POA: Diagnosis present

## 2017-10-11 DIAGNOSIS — B9562 Methicillin resistant Staphylococcus aureus infection as the cause of diseases classified elsewhere: Secondary | ICD-10-CM | POA: Diagnosis present

## 2017-10-11 DIAGNOSIS — E1129 Type 2 diabetes mellitus with other diabetic kidney complication: Secondary | ICD-10-CM

## 2017-10-11 DIAGNOSIS — M869 Osteomyelitis, unspecified: Secondary | ICD-10-CM | POA: Diagnosis present

## 2017-10-11 DIAGNOSIS — I129 Hypertensive chronic kidney disease with stage 1 through stage 4 chronic kidney disease, or unspecified chronic kidney disease: Secondary | ICD-10-CM | POA: Diagnosis present

## 2017-10-11 DIAGNOSIS — Z21 Asymptomatic human immunodeficiency virus [HIV] infection status: Secondary | ICD-10-CM

## 2017-10-11 DIAGNOSIS — B954 Other streptococcus as the cause of diseases classified elsewhere: Secondary | ICD-10-CM | POA: Diagnosis present

## 2017-10-11 DIAGNOSIS — R809 Proteinuria, unspecified: Secondary | ICD-10-CM

## 2017-10-11 DIAGNOSIS — I6359 Cerebral infarction due to unspecified occlusion or stenosis of other cerebral artery: Secondary | ICD-10-CM | POA: Diagnosis not present

## 2017-10-11 DIAGNOSIS — Z89022 Acquired absence of left finger(s): Secondary | ICD-10-CM | POA: Diagnosis not present

## 2017-10-11 DIAGNOSIS — Z888 Allergy status to other drugs, medicaments and biological substances status: Secondary | ICD-10-CM | POA: Diagnosis not present

## 2017-10-11 DIAGNOSIS — I639 Cerebral infarction, unspecified: Secondary | ICD-10-CM | POA: Diagnosis present

## 2017-10-11 DIAGNOSIS — E1140 Type 2 diabetes mellitus with diabetic neuropathy, unspecified: Secondary | ICD-10-CM | POA: Diagnosis present

## 2017-10-11 DIAGNOSIS — F17210 Nicotine dependence, cigarettes, uncomplicated: Secondary | ICD-10-CM | POA: Diagnosis present

## 2017-10-11 DIAGNOSIS — D631 Anemia in chronic kidney disease: Secondary | ICD-10-CM | POA: Diagnosis present

## 2017-10-11 DIAGNOSIS — Z720 Tobacco use: Secondary | ICD-10-CM | POA: Diagnosis present

## 2017-10-11 HISTORY — PX: AMPUTATION: SHX166

## 2017-10-11 LAB — CBC WITH DIFFERENTIAL/PLATELET
BASOS ABS: 0 10*3/uL (ref 0.0–0.1)
BASOS ABS: 0 10*3/uL (ref 0.0–0.1)
BASOS PCT: 0 %
Basophils Relative: 0 %
EOS ABS: 0.1 10*3/uL (ref 0.0–0.7)
EOS PCT: 1 %
EOS PCT: 1 %
Eosinophils Absolute: 0.1 10*3/uL (ref 0.0–0.7)
HCT: 38.4 % — ABNORMAL LOW (ref 39.0–52.0)
HEMATOCRIT: 39 % (ref 39.0–52.0)
Hemoglobin: 12.3 g/dL — ABNORMAL LOW (ref 13.0–17.0)
Hemoglobin: 12.6 g/dL — ABNORMAL LOW (ref 13.0–17.0)
LYMPHS PCT: 25 %
Lymphocytes Relative: 26 %
Lymphs Abs: 3 10*3/uL (ref 0.7–4.0)
Lymphs Abs: 2.8 10*3/uL (ref 0.7–4.0)
MCH: 28 pg (ref 26.0–34.0)
MCH: 27.9 pg (ref 26.0–34.0)
MCHC: 32 g/dL (ref 30.0–36.0)
MCHC: 32.3 g/dL (ref 30.0–36.0)
MCV: 87.3 fL (ref 78.0–100.0)
MCV: 86.5 fL (ref 78.0–100.0)
Monocytes Absolute: 0.5 10*3/uL (ref 0.1–1.0)
Monocytes Absolute: 0.6 10*3/uL (ref 0.1–1.0)
Monocytes Relative: 5 %
Monocytes Relative: 6 %
NEUTROS ABS: 7.6 10*3/uL (ref 1.7–7.7)
Neutro Abs: 7.6 10*3/uL (ref 1.7–7.7)
Neutrophils Relative %: 68 %
Neutrophils Relative %: 68 %
PLATELETS: 379 10*3/uL (ref 150–400)
Platelets: 398 10*3/uL (ref 150–400)
RBC: 4.4 MIL/uL (ref 4.22–5.81)
RBC: 4.51 MIL/uL (ref 4.22–5.81)
RDW: 16.3 % — AB (ref 11.5–15.5)
RDW: 16 % — ABNORMAL HIGH (ref 11.5–15.5)
WBC: 11.2 10*3/uL — AB (ref 4.0–10.5)
WBC: 11.2 10*3/uL — AB (ref 4.0–10.5)

## 2017-10-11 LAB — COMPREHENSIVE METABOLIC PANEL
ALBUMIN: 3.4 g/dL — AB (ref 3.5–5.0)
ALK PHOS: 69 U/L (ref 38–126)
ALT: 21 U/L (ref 17–63)
AST: 22 U/L (ref 15–41)
Anion gap: 10 (ref 5–15)
BILIRUBIN TOTAL: 0.3 mg/dL (ref 0.3–1.2)
BUN: 34 mg/dL — AB (ref 6–20)
CALCIUM: 9.4 mg/dL (ref 8.9–10.3)
CO2: 22 mmol/L (ref 22–32)
CREATININE: 1.93 mg/dL — AB (ref 0.61–1.24)
Chloride: 106 mmol/L (ref 101–111)
GFR calc Af Amer: 44 mL/min — ABNORMAL LOW (ref >60)
GFR, EST NON AFRICAN AMERICAN: 38 mL/min — AB (ref >60)
Glucose, Bld: 101 mg/dL — ABNORMAL HIGH (ref 65–99)
Potassium: 4.2 mmol/L (ref 3.5–5.1)
SODIUM: 138 mmol/L (ref 135–145)
TOTAL PROTEIN: 7.6 g/dL (ref 6.5–8.1)

## 2017-10-11 LAB — GLUCOSE, CAPILLARY
GLUCOSE-CAPILLARY: 91 mg/dL (ref 65–99)
Glucose-Capillary: 128 mg/dL — ABNORMAL HIGH (ref 65–99)

## 2017-10-11 LAB — I-STAT CG4 LACTIC ACID, ED: Lactic Acid, Venous: 1.1 mmol/L (ref 0.5–1.9)

## 2017-10-11 LAB — PROTIME-INR
INR: 0.92
PROTHROMBIN TIME: 12.2 s (ref 11.4–15.2)

## 2017-10-11 LAB — CBG MONITORING, ED: Glucose-Capillary: 75 mg/dL (ref 65–99)

## 2017-10-11 SURGERY — AMPUTATION DIGIT
Anesthesia: General | Laterality: Left

## 2017-10-11 MED ORDER — HYDROMORPHONE HCL 2 MG/ML IJ SOLN
0.5000 mg | INTRAMUSCULAR | Status: DC | PRN
  Administered 2017-10-12 – 2017-10-13 (×2): 0.5 mg via INTRAVENOUS
  Filled 2017-10-11 (×2): qty 1

## 2017-10-11 MED ORDER — FENOFIBRATE 54 MG PO TABS
54.0000 mg | ORAL_TABLET | Freq: Every day | ORAL | Status: DC
  Administered 2017-10-12 – 2017-10-14 (×3): 54 mg via ORAL
  Filled 2017-10-11 (×3): qty 1

## 2017-10-11 MED ORDER — OXYCODONE HCL 5 MG/5ML PO SOLN: 5.0000 mg | Freq: Once | ORAL | Status: DC | PRN

## 2017-10-11 MED ORDER — ACETAMINOPHEN 650 MG RE SUPP: 650.0000 mg | Freq: Four times a day (QID) | RECTAL | Status: DC | PRN

## 2017-10-11 MED ORDER — SULFAMETHOXAZOLE-TRIMETHOPRIM 400-80 MG PO TABS
1.0000 | ORAL_TABLET | Freq: Two times a day (BID) | ORAL | 1 refills | Status: DC
Start: 2017-10-11 — End: 2017-12-23

## 2017-10-11 MED ORDER — SUCCINYLCHOLINE CHLORIDE 20 MG/ML IJ SOLN
INTRAMUSCULAR | Status: DC | PRN
  Administered 2017-10-11: 60 mg via INTRAVENOUS

## 2017-10-11 MED ORDER — BUPIVACAINE HCL (PF) 0.5 % IJ SOLN
INTRAMUSCULAR | Status: AC
  Filled 2017-10-11: qty 30

## 2017-10-11 MED ORDER — PROMETHAZINE HCL 25 MG/ML IJ SOLN: 6.2500 mg | INTRAMUSCULAR | Status: DC | PRN

## 2017-10-11 MED ORDER — FENTANYL CITRATE (PF) 100 MCG/2ML IJ SOLN
INTRAMUSCULAR | Status: DC | PRN
  Administered 2017-10-11 (×2): 50 ug via INTRAVENOUS
  Administered 2017-10-11: 150 ug via INTRAVENOUS

## 2017-10-11 MED ORDER — ABACAVIR-DOLUTEGRAVIR-LAMIVUD 600-50-300 MG PO TABS
1.0000 | ORAL_TABLET | Freq: Every day | ORAL | Status: DC
  Administered 2017-10-12 – 2017-10-14 (×3): 1 via ORAL
  Filled 2017-10-11 (×4): qty 1

## 2017-10-11 MED ORDER — INSULIN ASPART 100 UNIT/ML ~~LOC~~ SOLN
0.0000 [IU] | Freq: Three times a day (TID) | SUBCUTANEOUS | Status: DC
  Administered 2017-10-12: 5 [IU] via SUBCUTANEOUS
  Administered 2017-10-12: 3 [IU] via SUBCUTANEOUS
  Administered 2017-10-12: 5 [IU] via SUBCUTANEOUS
  Administered 2017-10-13: 2 [IU] via SUBCUTANEOUS
  Administered 2017-10-14: 3 [IU] via SUBCUTANEOUS

## 2017-10-11 MED ORDER — TOPIRAMATE 25 MG PO TABS: 25.0000 mg | ORAL_TABLET | Freq: Every day | ORAL | Status: DC

## 2017-10-11 MED ORDER — DEXAMETHASONE SODIUM PHOSPHATE 4 MG/ML IJ SOLN
INTRAMUSCULAR | Status: DC | PRN
  Administered 2017-10-11: 8 mg via INTRAVENOUS

## 2017-10-11 MED ORDER — PIPERACILLIN-TAZOBACTAM 3.375 G IVPB
3.3750 g | Freq: Three times a day (TID) | INTRAVENOUS | Status: DC
  Administered 2017-10-12 (×2): 3.375 g via INTRAVENOUS
  Filled 2017-10-11 (×4): qty 50

## 2017-10-11 MED ORDER — PIPERACILLIN-TAZOBACTAM 3.375 G IVPB 30 MIN
3.3750 g | Freq: Once | INTRAVENOUS | Status: AC
  Administered 2017-10-11: 3.375 g via INTRAVENOUS
  Filled 2017-10-11: qty 50

## 2017-10-11 MED ORDER — SENNOSIDES-DOCUSATE SODIUM 8.6-50 MG PO TABS: 1.0000 | ORAL_TABLET | Freq: Every evening | ORAL | Status: DC | PRN

## 2017-10-11 MED ORDER — HYDRALAZINE HCL 20 MG/ML IJ SOLN: 5.0000 mg | INTRAMUSCULAR | Status: DC | PRN

## 2017-10-11 MED ORDER — INSULIN ASPART 100 UNIT/ML ~~LOC~~ SOLN
0.0000 [IU] | Freq: Every day | SUBCUTANEOUS | Status: DC
  Administered 2017-10-12: 4 [IU] via SUBCUTANEOUS

## 2017-10-11 MED ORDER — LIDOCAINE HCL (PF) 1 % IJ SOLN
INTRAMUSCULAR | Status: AC
  Filled 2017-10-11: qty 30

## 2017-10-11 MED ORDER — INSULIN GLARGINE 100 UNIT/ML ~~LOC~~ SOLN
10.0000 [IU] | Freq: Every day | SUBCUTANEOUS | Status: DC
  Administered 2017-10-11: 10 [IU] via SUBCUTANEOUS
  Filled 2017-10-11: qty 0.1

## 2017-10-11 MED ORDER — HYDROMORPHONE HCL 2 MG/ML IJ SOLN: 0.2500 mg | INTRAMUSCULAR | Status: DC | PRN

## 2017-10-11 MED ORDER — LIDOCAINE HCL 2 % IJ SOLN
10.0000 mL | Freq: Once | INTRAMUSCULAR | Status: DC
  Filled 2017-10-11: qty 20

## 2017-10-11 MED ORDER — LACTATED RINGERS IV SOLN
INTRAVENOUS | Status: DC
  Administered 2017-10-11 (×2): via INTRAVENOUS

## 2017-10-11 MED ORDER — 0.9 % SODIUM CHLORIDE (POUR BTL) OPTIME
TOPICAL | Status: DC | PRN
  Administered 2017-10-11: 1000 mL

## 2017-10-11 MED ORDER — VANCOMYCIN HCL 10 G IV SOLR
1500.0000 mg | Freq: Once | INTRAVENOUS | Status: AC
  Administered 2017-10-11: 1500 mg via INTRAVENOUS
  Filled 2017-10-11: qty 1500

## 2017-10-11 MED ORDER — PHENYLEPHRINE HCL 10 MG/ML IJ SOLN
INTRAMUSCULAR | Status: DC | PRN
  Administered 2017-10-11: 120 ug via INTRAVENOUS
  Administered 2017-10-11: 80 ug via INTRAVENOUS

## 2017-10-11 MED ORDER — ATORVASTATIN CALCIUM 10 MG PO TABS
10.0000 mg | ORAL_TABLET | Freq: Every day | ORAL | Status: DC
  Administered 2017-10-13 – 2017-10-14 (×2): 10 mg via ORAL
  Filled 2017-10-11 (×3): qty 1

## 2017-10-11 MED ORDER — OXYCODONE HCL 5 MG PO TABS: 5.0000 mg | ORAL_TABLET | Freq: Once | ORAL | Status: DC | PRN

## 2017-10-11 MED ORDER — MIDAZOLAM HCL 2 MG/2ML IJ SOLN
INTRAMUSCULAR | Status: AC
  Filled 2017-10-11: qty 2

## 2017-10-11 MED ORDER — SODIUM CHLORIDE 0.9 % IV BOLUS
500.0000 mL | Freq: Once | INTRAVENOUS | Status: AC
  Administered 2017-10-11: 500 mL via INTRAVENOUS

## 2017-10-11 MED ORDER — BUPIVACAINE-EPINEPHRINE (PF) 0.5% -1:200000 IJ SOLN
INTRAMUSCULAR | Status: AC
  Filled 2017-10-11: qty 30

## 2017-10-11 MED ORDER — PANTOPRAZOLE SODIUM 40 MG PO TBEC
40.0000 mg | DELAYED_RELEASE_TABLET | Freq: Every day | ORAL | Status: DC
  Administered 2017-10-12 – 2017-10-14 (×3): 40 mg via ORAL
  Filled 2017-10-11 (×3): qty 1

## 2017-10-11 MED ORDER — VANCOMYCIN HCL IN DEXTROSE 1-5 GM/200ML-% IV SOLN
1000.0000 mg | Freq: Once | INTRAVENOUS | Status: DC
  Filled 2017-10-11: qty 200

## 2017-10-11 MED ORDER — SODIUM CHLORIDE 0.9 % IV SOLN
INTRAVENOUS | Status: AC
Start: 2017-10-11 — End: 2017-10-13
  Administered 2017-10-11 – 2017-10-12 (×2): via INTRAVENOUS

## 2017-10-11 MED ORDER — TRAZODONE HCL 50 MG PO TABS: 25.0000 mg | ORAL_TABLET | Freq: Every evening | ORAL | Status: DC | PRN

## 2017-10-11 MED ORDER — VANCOMYCIN HCL IN DEXTROSE 750-5 MG/150ML-% IV SOLN
750.0000 mg | Freq: Two times a day (BID) | INTRAVENOUS | Status: DC
  Administered 2017-10-12: 750 mg via INTRAVENOUS
  Filled 2017-10-11 (×3): qty 150

## 2017-10-11 MED ORDER — LIDOCAINE HCL (CARDIAC) 20 MG/ML IV SOLN
INTRAVENOUS | Status: DC | PRN
  Administered 2017-10-11: 60 mg via INTRAVENOUS

## 2017-10-11 MED ORDER — FENTANYL CITRATE (PF) 250 MCG/5ML IJ SOLN
INTRAMUSCULAR | Status: AC
  Filled 2017-10-11: qty 5

## 2017-10-11 MED ORDER — ONDANSETRON HCL 4 MG/2ML IJ SOLN
4.0000 mg | Freq: Four times a day (QID) | INTRAMUSCULAR | Status: DC | PRN
  Administered 2017-10-11: 4 mg via INTRAVENOUS

## 2017-10-11 MED ORDER — SODIUM CHLORIDE 0.9 % IV BOLUS
500.0000 mL | Freq: Once | INTRAVENOUS | Status: AC
Start: 2017-10-11 — End: 2017-10-11
  Administered 2017-10-11: 500 mL via INTRAVENOUS

## 2017-10-11 MED ORDER — MIDAZOLAM HCL 5 MG/5ML IJ SOLN
INTRAMUSCULAR | Status: DC | PRN
  Administered 2017-10-11: 2 mg via INTRAVENOUS

## 2017-10-11 MED ORDER — PROPOFOL 10 MG/ML IV BOLUS
INTRAVENOUS | Status: DC | PRN
  Administered 2017-10-11: 200 mg via INTRAVENOUS

## 2017-10-11 MED ORDER — BUPIVACAINE HCL (PF) 0.5 % IJ SOLN
INTRAMUSCULAR | Status: DC | PRN
  Administered 2017-10-11: 10 mL

## 2017-10-11 MED ORDER — MORPHINE SULFATE (PF) 4 MG/ML IV SOLN: 1.0000 mg | INTRAVENOUS | Status: DC | PRN

## 2017-10-11 MED ORDER — ACETAMINOPHEN 325 MG PO TABS: 650.0000 mg | ORAL_TABLET | Freq: Four times a day (QID) | ORAL | Status: DC | PRN

## 2017-10-11 MED ORDER — ONDANSETRON HCL 4 MG PO TABS: 4.0000 mg | ORAL_TABLET | Freq: Four times a day (QID) | ORAL | Status: DC | PRN

## 2017-10-11 MED ORDER — BACITRACIN ZINC 500 UNIT/GM EX OINT
TOPICAL_OINTMENT | CUTANEOUS | Status: AC
  Filled 2017-10-11: qty 28.35

## 2017-10-11 SURGICAL SUPPLY — 43 items
BANDAGE COBAN STERILE 2 (GAUZE/BANDAGES/DRESSINGS) IMPLANT
BLADE AVERAGE 25MMX9MM (BLADE)
BLADE AVERAGE 25X9 (BLADE) IMPLANT
BNDG COHESIVE 1X5 TAN STRL LF (GAUZE/BANDAGES/DRESSINGS) ×3 IMPLANT
BNDG COHESIVE 4X5 TAN NS LF (GAUZE/BANDAGES/DRESSINGS) IMPLANT
BNDG CONFORM 2 STRL LF (GAUZE/BANDAGES/DRESSINGS) ×3 IMPLANT
BNDG CONFORM 3 STRL LF (GAUZE/BANDAGES/DRESSINGS) ×3 IMPLANT
BNDG ELASTIC 2X5.8 VLCR STR LF (GAUZE/BANDAGES/DRESSINGS) IMPLANT
BNDG ESMARK 4X9 LF (GAUZE/BANDAGES/DRESSINGS) IMPLANT
BNDG GAUZE ELAST 4 BULKY (GAUZE/BANDAGES/DRESSINGS) ×6 IMPLANT
CHLORAPREP W/TINT 26ML (MISCELLANEOUS) ×3 IMPLANT
CORDS BIPOLAR (ELECTRODE) ×3 IMPLANT
COVER SURGICAL LIGHT HANDLE (MISCELLANEOUS) ×3 IMPLANT
DRAPE SURG 17X23 STRL (DRAPES) ×3 IMPLANT
DRSG EMULSION OIL 3X3 NADH (GAUZE/BANDAGES/DRESSINGS) ×3 IMPLANT
ELECT REM PT RETURN 9FT ADLT (ELECTROSURGICAL) ×3
ELECTRODE REM PT RTRN 9FT ADLT (ELECTROSURGICAL) ×1 IMPLANT
GAUZE SPONGE 2X2 8PLY STRL LF (GAUZE/BANDAGES/DRESSINGS) ×1 IMPLANT
GAUZE SPONGE 4X4 12PLY STRL (GAUZE/BANDAGES/DRESSINGS) ×3 IMPLANT
GAUZE XEROFORM 1X8 LF (GAUZE/BANDAGES/DRESSINGS) ×3 IMPLANT
GLOVE BIO SURGEON STRL SZ7.5 (GLOVE) ×3 IMPLANT
GLOVE BIOGEL PI IND STRL 8 (GLOVE) ×1 IMPLANT
GLOVE BIOGEL PI INDICATOR 8 (GLOVE) ×2
GOWN STRL REUS W/ TWL XL LVL3 (GOWN DISPOSABLE) ×1 IMPLANT
GOWN STRL REUS W/TWL XL LVL3 (GOWN DISPOSABLE) ×2
KIT BASIN OR (CUSTOM PROCEDURE TRAY) ×3 IMPLANT
NEEDLE HYPO 25X1 1.5 SAFETY (NEEDLE) IMPLANT
PACK ORTHO EXTREMITY (CUSTOM PROCEDURE TRAY) ×3 IMPLANT
PAD CAST 4YDX4 CTTN HI CHSV (CAST SUPPLIES) IMPLANT
PADDING CAST ABS 4INX4YD NS (CAST SUPPLIES) ×2
PADDING CAST ABS COTTON 4X4 ST (CAST SUPPLIES) ×1 IMPLANT
PADDING CAST COTTON 4X4 STRL (CAST SUPPLIES)
PENCIL BUTTON HOLSTER BLD 10FT (ELECTRODE) IMPLANT
SPONGE GAUZE 2X2 STER 10/PKG (GAUZE/BANDAGES/DRESSINGS) ×2
SUT ETHILON 4 0 PS 2 18 (SUTURE) IMPLANT
SUT MNCRL AB 4-0 PS2 18 (SUTURE) IMPLANT
SUT VICRYL RAPIDE 4/0 PS 2 (SUTURE) ×3 IMPLANT
SYR 10ML LL (SYRINGE) IMPLANT
TOWEL OR 17X24 6PK STRL BLUE (TOWEL DISPOSABLE) ×3 IMPLANT
TUBE CONNECTING 12'X1/4 (SUCTIONS) ×1
TUBE CONNECTING 12X1/4 (SUCTIONS) ×2 IMPLANT
UNDERPAD 30X30 (UNDERPADS AND DIAPERS) ×3 IMPLANT
YANKAUER SUCT BULB TIP NO VENT (SUCTIONS) ×3 IMPLANT

## 2017-10-11 NOTE — Telephone Encounter (Signed)
Per Dr. Johnnye Sima called [facility] to follow up with a referral for the pt to be seen at their office. Pt missed his appt on 05/26/17 with Dr. Posey Pronto. Spoke with Baldo Ash who was able to help me reschedule this missed appt for the pt. Pt is currently scheduled for May 1st at 10:40 must arrive 10 mins before appt.  If pt needs to reschedule must call their office at [phone].  Will inform pt of this appt.  [place]

## 2017-10-11 NOTE — Assessment & Plan Note (Signed)
Will re-make his pain appt.

## 2017-10-11 NOTE — Assessment & Plan Note (Signed)
Cx sent Will start him on bactrim Will have him seen by hand ASAP or send him to ED I explained to him that he could lose part of his finger.
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
## 2017-10-11 NOTE — ED Notes (Signed)
ED Provider at bedside. 

## 2017-10-11 NOTE — ED Notes (Signed)
Delay in care due to code response.

## 2017-10-11 NOTE — Telephone Encounter (Signed)
Hand Surgeon office called stating that the pt could not be seen at their office, but could be seen at the ED if pt needs to be seen immediatly. Was given Dr. Biagio Borg number (Dr on call) if Dr. Johnnye Sima would like for the pt to be seen at their office. Dr. Johnnye Sima advised the pt go to the ED. PT stated he would go today to be seen. [place]

## 2017-10-11 NOTE — Discharge Instructions (Addendum)
May shower, letting finger have soap and running water, but minimizing it.  Do not directly soak it underwater. Re-dress with light dry gauze dressing daily Work on exercises Dr. Grandville Silos showed you to do daily to preserve your motion  An old MRI shows you have had a stroke in the past. Take a baby aspirin daily if you have no issues with stomach ulcers or heartburn.  You were cared for by a hospitalist during your hospital stay. If you have any questions about your discharge medications or the care you received while you were in the hospital after you are discharged, you can call the unit and asked to speak with the hospitalist on call if the hospitalist that took care of you is not available. Once you are discharged, your primary care physician will handle any further medical issues. Please note that NO REFILLS for any discharge medications will be authorized once you are discharged, as it is imperative that you return to your primary care physician (or establish a relationship with a primary care physician if you do not have one) for your aftercare needs so that they can reassess your need for medications and monitor your lab values.  Please take all your medications with you for your next visit with your Primary MD. Please ask your Primary MD to get all Hospital records sent to his/her office. Please request your Primary MD to go over all hospital test results at the follow up.    If you experience worsening of your admission symptoms, develop shortness of breath, chest pain, suicidal or homicidal thoughts or a life threatening emergency, you must seek medical attention immediately by calling 911 or calling your MD.   Dennis Bast must read the complete instructions/literature along with all the possible adverse reactions/side effects for all the medicines you take including new medications that have been prescribed to you. Take new medicines after you have completely understood and accpet all the possible  adverse reactions/side effects.    Do not drive when taking pain medications or sedatives.     Do not take more than prescribed Pain, Sleep and Anxiety Medications   If you have smoked or chewed Tobacco in the last 2 yrs please stop. Stop any regular alcohol  and or recreational drug use.   Wear Seat belts while driving.

## 2017-10-11 NOTE — Telephone Encounter (Signed)
Per Dr. Johnnye Sima called Hand Surgeon office for pt to be seen today or tomorrow (10/11/17-10/12/17). Pt has an infected index finger on left hand. Hand surgeon office will call us back to make an appt. [place]

## 2017-10-11 NOTE — ED Provider Notes (Signed)
[facility] EMERGENCY DEPARTMENT Provider Note   CSN: [id number] Arrival date & time: 10/11/17  1112     History   Chief Complaint Chief Complaint  Patient presents with  . Finger Injury    HPI Donald Ross is a 57 y.o. male.  HPI  57 y.o. Male ho hiv, t2dm presents today from ID clinic with complaints of left second finger infection.  States cut fingertip on rusty metal one day "last week".  He has had increased swelling, pus, pain through today.  He pulled off part of the finger nail today as he states it was swelling and separating.  He has noted pus and drainage.  Pain and swelling of entire finger worse with attempts at flexion and extension.  No noted fever, chills, n/v/d.  Seen by Dr. Johnnye Sima and sent to ED.  Past Medical History:  Diagnosis Date  . Allergy   . Anxiety   . Depression   . Diabetes mellitus without complication ([place])   . GERD (gastroesophageal reflux disease)   . HIV infection ([place])   . Hypertension   . Kidney stones 2016  . Numbness   . Pain   . Stroke [facility]) 10/07/2015    Patient Active Problem List   Diagnosis Date Noted  . Abscess of finger of left hand 10/11/2017  . Hypertension 04/12/2017  . Pain 03/09/2017  . Neuropathy due to secondary diabetes ([place]) 08/05/2016  . Hyperlipidemia associated with type 2 diabetes mellitus ([place]) 08/05/2016  . Asymptomatic HIV infection ([place]) 04/03/2016  . Diabetes mellitus type 2 with complications ([place]) 35/57/3220  . CVA (cerebral vascular accident) ([place]) 04/03/2016  . Tobacco abuse 04/03/2016    Past Surgical History:  Procedure Laterality Date  . EYE SURGERY Bilateral 1968  . LITHOTRIPSY  2016  . TENDON REPAIR Left 2016   thumb        Home Medications    Prior to Admission medications   Medication Sig Start Date End Date Taking? Authorizing Provider  abacavir-dolutegravir-lamiVUDine (TRIUMEQ) 600-50-300 MG tablet Take 1 tablet by mouth daily. 04/12/17   Campbell Riches, MD  aspirin EC 81 MG tablet Take 1 tablet (81 mg total) by mouth daily. Patient not taking: Reported on 10/11/2017 04/03/16   Campbell Riches, MD  atorvastatin (LIPITOR) 10 MG tablet Take 10 mg by mouth daily. 09/16/16 06/15/18  [provider]  cyclobenzaprine (FLEXERIL) 10 MG tablet Take 10 mg by mouth at bedtime.    [provider]  fenofibrate micronized (LOFIBRA) 134 MG capsule Take 134 mg by mouth daily before breakfast. 03/09/16   [provider]  gabapentin (NEURONTIN) 800 MG tablet Take 800 mg by mouth 3 (three) times daily. 03/09/16   [provider]  HYDROcodone-acetaminophen (NORCO/VICODIN) 5-325 MG tablet  03/26/17   [provider]  Insulin Glargine (LANTUS SOLOSTAR) 100 UNIT/ML Solostar Pen Inject 48 Units into the skin.  05/04/16   [provider]  losartan-hydrochlorothiazide (HYZAAR) 50-12.5 MG tablet Take 1 tablet by mouth daily. 03/09/16   [provider]  metFORMIN (GLUCOPHAGE) 1000 MG tablet Take 1,000 mg by mouth 2 (two) times daily with a meal. 03/09/16   [provider]  methocarbamol (ROBAXIN) 500 MG tablet Take 1 tablet (500 mg total) by mouth every 8 (eight) hours as needed for muscle spasms. Patient not taking: Reported on 10/11/2017 08/17/17   Davonna Belling, MD  nortriptyline (PAMELOR) 25 MG capsule Take 2 capsules (50 mg total) by mouth at bedtime. Patient not  taking: Reported on 04/12/2017 03/09/17   Marcial Pacas, MD  omeprazole (PRILOSEC) 40 MG capsule Take 40 mg by mouth daily. 03/09/16   [provider]  sitaGLIPtin (JANUVIA) 50 MG tablet Take 50 mg by mouth. 05/04/16   [provider]  sulfamethoxazole-trimethoprim (BACTRIM) 400-80 MG tablet Take 1 tablet by mouth 2 (two) times daily. 10/11/17   Campbell Riches, MD  topiramate (TOPAMAX) 25 MG tablet Take 25 mg by mouth daily. 09/16/16   [provider]  traMADol (ULTRAM) 50 MG tablet Take by mouth 2 (two) times daily.     [provider]    Family History Family History  Problem Relation Age of Onset  . CVA Mother        3 strokes  . Alzheimer's disease Mother   . Colon cancer Neg Hx     Social History Social History   Tobacco Use  . Smoking status: Current Every Day Smoker    Packs/day: 0.50    Years: 39.00    Pack years: 19.50    Types: Cigarettes    Start date: 04/03/1977  . Smokeless tobacco: Never Used  Substance Use Topics  . Alcohol use: No  . Drug use: No     Allergies   Clonazepam   Review of Systems Review of Systems  Constitutional: Negative.   HENT: Negative.   Eyes: Negative.   Respiratory: Negative.   Cardiovascular: Negative.   Gastrointestinal: Negative.   Endocrine: Negative.   Genitourinary: Negative.   All other systems reviewed and are negative.    Physical Exam Updated Vital Signs BP (!) 138/98 (BP Location: Right Arm)   Pulse 81   Temp 98.2 F (36.8 C) (Oral)   Resp 20   SpO2 100%   Physical Exam   ED Treatments / Results  Labs (all labs ordered are listed, but only abnormal results are displayed) Labs Reviewed  COMPREHENSIVE METABOLIC PANEL - Abnormal; Notable for the following components:      Result Value   Glucose, Bld 101 (*)    BUN 34 (*)    Creatinine, Ser 1.93 (*)    Albumin 3.4 (*)    GFR calc non Af Amer 38 (*)    GFR calc Af Amer 44 (*)    All other components within normal limits  CBC WITH DIFFERENTIAL/PLATELET - Abnormal; Notable for the following components:   WBC 11.2 (*)    Hemoglobin 12.3 (*)    HCT 38.4 (*)    RDW 16.3 (*)    All other components within normal limits  CULTURE, BLOOD (ROUTINE X 2)  CULTURE, BLOOD (ROUTINE X 2)  CBC WITH DIFFERENTIAL/PLATELET    EKG None  Radiology Dg Finger Index Left  Result Date: 10/11/2017 CLINICAL DATA:  The patient suffered a laceration of the left index finger on a razor blade 1 week ago. Pain, redness and swelling. Initial encounter. EXAM: LEFT INDEX FINGER  2+V COMPARISON:  None. FINDINGS: Soft tissues of the index finger are swollen. There is bony destructive change throughout the distal phalanx consistent with osteomyelitis. No radiopaque foreign body is identified. IMPRESSION: Bony destructive change throughout the distal phalanx of the index finger consistent with osteomyelitis. Associated soft tissue swelling is noted. Electronically Signed   By: Inge Rise M.D.   On: 10/11/2017 12:51    Procedures Procedures (including critical care time)  Medications Ordered in ED Medications  sodium chloride 0.9 % bolus 500 mL (has no administration in time range)  piperacillin-tazobactam (ZOSYN)  IVPB 3.375 g (has no administration in time range)  vancomycin (VANCOCIN) IVPB 1000 mg/200 mL premix (has no administration in time range)     Initial Impression / Assessment and Plan / ED Course  I have reviewed the triage vital signs and the nursing notes.  Pertinent labs & imaging results that were available during my care of the patient were reviewed by me and considered in my medical decision making (see chart for details).    Finger infection with osteo with hiv infection and dm.  IV abx ordered, hand consult pending. Reviewed  Final Clinical Impressions(s) / ED Diagnoses   Final diagnoses:  None    ED Discharge Orders    None       Pattricia Boss, MD 11/14/17 1128

## 2017-10-11 NOTE — Anesthesia Preprocedure Evaluation (Addendum)
Anesthesia Evaluation  Patient identified by MRN, date of birth, ID band Patient awake    Reviewed: Allergy & Precautions, NPO status , Patient's Chart, lab work & pertinent test results  Airway Mallampati: II  TM Distance: >3 FB Neck ROM: Full    Dental  (+) Missing,    Pulmonary Current Smoker,    Pulmonary exam normal breath sounds clear to auscultation       Cardiovascular hypertension, Pt. on medications Normal cardiovascular exam Rhythm:Regular Rate:Normal  ECG: ST, rate 102   Neuro/Psych PSYCHIATRIC DISORDERS Anxiety Depression diabetic neuropathy CVA (left side weakness), Residual Symptoms    GI/Hepatic Neg liver ROS, GERD  Medicated,  Endo/Other  diabetes, Insulin Dependent, Oral Hypoglycemic Agents  Renal/GU Renal disease     Musculoskeletal negative musculoskeletal ROS (+)   Abdominal   Peds  Hematology  (+) anemia , HIV, HLD   Anesthesia Other Findings LEFT INDEX FINGER AMPUTATION  Reproductive/Obstetrics                            Anesthesia Physical Anesthesia Plan  ASA: III and emergent  Anesthesia Plan: General   Post-op Pain Management:    Induction: Intravenous  PONV Risk Score and Plan: 1 and Ondansetron, Dexamethasone, Midazolam and Treatment may vary due to age or medical condition  Airway Management Planned: Oral ETT  Additional Equipment:   Intra-op Plan:   Post-operative Plan: Extubation in OR  Informed Consent: I have reviewed the patients History and Physical, chart, labs and discussed the procedure including the risks, benefits and alternatives for the proposed anesthesia with the patient or authorized representative who has indicated his/her understanding and acceptance.   Dental advisory given  Plan Discussed with: CRNA  Anesthesia Plan Comments:        Anesthesia Quick Evaluation

## 2017-10-11 NOTE — Addendum Note (Signed)
Addended by: Dolan Amen D on: 10/11/2017 10:59 AM   Modules accepted: Orders

## 2017-10-11 NOTE — Progress Notes (Signed)
   Subjective:    Patient ID: Donald Ross, male    DOB: 09-Aug-1960, 57 y.o.   MRN: [id number]  HPI 57 yo M with hx of HIV+ since June 2005. Has been undetectable the entire time by his account.  Was prev on Kaletra (gave him DM), Truvada, Atazanvir.  Was been on Issentress/epzicom for 5-6 years -->  triumeq 07-2016.  As well as DM2 since 2005, HTN, hyperlipidemia as well as CVA 09-2015. Has paraplegia resulting from this and numbness on half of his body. Has diabetic neuropathy as well.   He continues to have pain. Has never been to pain clinic. Is taking tramadol, hydrocodone 5mg  prn. At his last ID visit he was referred to pain clinic- says they didn't call him back.  Was unable to sleep last night due to pain.   Does not check his FSG at home: no dizziness or lightheadedness.   Has not had colon.   Last week he cut himself with a "rustty razor" on his l 2nd digit. He has had worsening pain in his hand with movement. No f/c. Has not been eval since then. Has had pus and blood draining from his finger.  Had Tdap 07-2017.   Denies missed ART.   HIV 1 RNA Quant (copies/mL)  Date Value  04/12/2017 <20 NOT DETECTED  08/05/2016 <20 NOT DETECTED  04/07/2016 46 (H)   CD4 T Cell Abs (/uL)  Date Value  04/12/2017 1,850  08/05/2016 1,710  04/07/2016 1,540     Review of Systems  Constitutional: Negative for appetite change, chills, fever and unexpected weight change.  Eyes: Negative for visual disturbance.  Gastrointestinal: Negative for constipation and diarrhea.  Genitourinary: Negative for difficulty urinating.  Skin: Positive for wound.  Neurological: Positive for numbness.  Psychiatric/Behavioral: Positive for sleep disturbance.  Please see HPI. All other systems reviewed and negative.      Objective:   Physical Exam  Constitutional: He appears well-developed and well-nourished.  HENT:  Mouth/Throat: No oropharyngeal exudate.  Eyes: Pupils are equal, round, and  reactive to light. EOM are normal.  Neck: Neck supple.  Cardiovascular: Normal rate, regular rhythm and normal heart sounds.  Pulmonary/Chest: Effort normal and breath sounds normal.  Abdominal: Soft. Bowel sounds are normal. There is no tenderness. There is no rebound.  Musculoskeletal: Normal range of motion. He exhibits no edema.       Arms:      Feet:  Lymphadenopathy:    He has no cervical adenopathy.          Assessment & Plan:

## 2017-10-11 NOTE — H&P (Signed)
History and Physical    Donald Ross IWP:[id number] DOB: [birth year] DOA: 10/11/2017  PCP: Donald Limbo, MD Patient coming from: home  Chief Complaint: finger wound  HPI: Donald Ross is a 57 y.o. male with medical history significant HIV since 2005, hypertension, hyperlipidemia, diabetes, diabetic neuropathy presents to the emergency Department chief complaint infected wound on his left index finger. Initial evaluation includes -xray of left index finger consistent with osteomyelitis. [facility] are asked to admit.  Information is obtained from the patient and the chart. He reports he cut his fingertip on a resting male approximately 7 days ago. Since then he's developed gradual increased swelling erythema heat and pain. 2 days ago he states the wound "opened and drained pus". He describes the pain as constant and a located in that left index finger. He denies any nausea or vomiting. He denies headache fever chills syncope or near-syncope. He denies chest pain palpitations shortness of breath cough. He denies any changes in his diabetes regimen. He denies dysuria hematuria frequency or urgency.   ED Course: n the emergency department is afebrile hemodynamically stable and not hypoxic  Review of Systems: As per HPI otherwise all other systems reviewed and are negative.   Ambulatory Status:ambulates independently and is independent with ADLs  Past Medical History:  Diagnosis Date  . Allergy   . Anxiety   . Depression   . Diabetes mellitus without complication ([place])   . GERD (gastroesophageal reflux disease)   . HIV infection ([place])   . Hypertension   . Kidney stones 2016  . Numbness   . Pain   . Stroke [facility] [place]) 10/07/2015    Past Surgical History:  Procedure Laterality Date  . EYE SURGERY Bilateral 1968  . LITHOTRIPSY  2016  . TENDON REPAIR Left 2016   thumb    Social History   Socioeconomic History  . Marital status: Legally Separated    Spouse name: Not on  file  . Number of children: 2  . Years of education: GED  . Highest education level: Not on file  Occupational History  . Occupation: Unemployed  Social Needs  . Financial resource strain: Not on file  . Food insecurity:    Worry: Not on file    Inability: Not on file  . Transportation needs:    Medical: Not on file    Non-medical: Not on file  Tobacco Use  . Smoking status: Current Every Day Smoker    Packs/day: 0.50    Years: 39.00    Pack years: 19.50    Types: Cigarettes    Start date: 04/03/1977  . Smokeless tobacco: Never Used  Substance and Sexual Activity  . Alcohol use: No  . Drug use: No  . Sexual activity: Yes    Partners: Female    Birth control/protection: None    Comment: condomds given  Lifestyle  . Physical activity:    Days per week: Not on file    Minutes per session: Not on file  . Stress: Not on file  Relationships  . Social connections:    Talks on phone: Not on file    Gets together: Not on file    Attends religious service: Not on file    Active member of club or organization: Not on file    Attends meetings of clubs or organizations: Not on file    Relationship status: Not on file  . Intimate partner violence:    Fear of current or ex partner: Not on file  Emotionally abused: Not on file    Physically abused: Not on file    Forced sexual activity: Not on file  Other Topics Concern  . Not on file  Social History Narrative   Lives at home with fiancee.   Right-handed.   Drinks 3-liter of soda every three days.    Allergies  Allergen Reactions  . Clonazepam Swelling    Family History  Problem Relation Age of Onset  . CVA Mother        3 strokes  . Alzheimer's disease Mother   . Colon cancer Neg Hx     Prior to Admission medications   Medication Sig Start Date End Date Taking? Authorizing Provider  abacavir-dolutegravir-lamiVUDine (TRIUMEQ) 600-50-300 MG tablet Take 1 tablet by mouth daily. 04/12/17   Campbell Riches, MD    atorvastatin (LIPITOR) 10 MG tablet Take 10 mg by mouth daily. 09/16/16 06/15/18  [provider]  cyclobenzaprine (FLEXERIL) 10 MG tablet Take 10 mg by mouth at bedtime.    [provider]  fenofibrate micronized (LOFIBRA) 134 MG capsule Take 134 mg by mouth daily before breakfast. 03/09/16   [provider]  gabapentin (NEURONTIN) 800 MG tablet Take 800 mg by mouth 3 (three) times daily. 03/09/16   [provider]  HYDROcodone-acetaminophen (NORCO/VICODIN) 5-325 MG tablet  03/26/17   [provider]  Insulin Glargine (LANTUS SOLOSTAR) 100 UNIT/ML Solostar Pen Inject 48 Units into the skin.  05/04/16   [provider]  losartan-hydrochlorothiazide (HYZAAR) 50-12.5 MG tablet Take 1 tablet by mouth daily. 03/09/16   [provider]  metFORMIN (GLUCOPHAGE) 1000 MG tablet Take 1,000 mg by mouth 2 (two) times daily with a meal. 03/09/16   [provider]  methocarbamol (ROBAXIN) 500 MG tablet Take 1 tablet (500 mg total) by mouth every 8 (eight) hours as needed for muscle spasms. Patient not taking: Reported on 10/11/2017 08/17/17   Davonna Belling, MD  omeprazole (PRILOSEC) 40 MG capsule Take 40 mg by mouth daily. 03/09/16   [provider]  sitaGLIPtin (JANUVIA) 50 MG tablet Take 50 mg by mouth. 05/04/16   [provider]  sulfamethoxazole-trimethoprim (BACTRIM) 400-80 MG tablet Take 1 tablet by mouth 2 (two) times daily. 10/11/17   Campbell Riches, MD  topiramate (TOPAMAX) 25 MG tablet Take 25 mg by mouth daily. 09/16/16   [provider]  traMADol (ULTRAM) 50 MG tablet Take by mouth 2 (two) times daily.    [provider]    Physical Exam: Vitals:   10/11/17 1200 10/11/17 1316  BP: (!) 140/91 (!) 138/98  Pulse: (!) 104 81  Resp: 18 20  Temp: 98 F (36.7 C) 98.2 F (36.8 C)  TempSrc: Oral Oral  SpO2: 100% 100%     General:  Appears calm and comfortable sitting up in bed in no acute  distress Eyes:  PERRL, EOMI, normal lids, iris ENT:  grossly normal hearing, lips & tongue, mucous membranes of his mouth are slightly dry but pink Neck:  no LAD, masses or thyromegaly Cardiovascular:  RRR, no m/r/g. No LE edema.  Respiratory:  CTA bilaterally, no w/r/r. Normal respiratory effort. Abdomen:  soft, ntnd, positive bowel sounds throughout no guarding or rebounding Skin:  no rash or induration seen on limited exam Musculoskeletal:  grossly normal tone BUE/BLE, good ROM, no bony abnormality. Left index finger with skin on tip of finger quite pale and peeling mild erythema below that tender to palpation swelling Psychiatric:  grossly normal mood and  affect, speech fluent and appropriate, AOx3 Neurologic:  CN 2-12 grossly intact, moves all extremities in coordinated fashion, sensation intact  Labs on Admission: I have personally reviewed following labs and imaging studies  CBC: Recent Labs  Lab 10/11/17 1049 10/11/17 1208 10/11/17 1651  WBC 9.7 11.2* 11.2*  NEUTROABS  --  7.6 7.6  HGB 12.2* 12.3* 12.6*  HCT 37.5* 38.4* 39.0  MCV 83.7 87.3 86.5  PLT 407* 398 202   Basic Metabolic Panel: Recent Labs  Lab 10/11/17 1049 10/11/17 1208  NA 138 138  K 4.4 4.2  CL 106 106  CO2 24 22  GLUCOSE 106* 101*  BUN 36* 34*  CREATININE 2.04* 1.93*  CALCIUM 9.6 9.4   GFR: Estimated Creatinine Clearance: 42.3 mL/min (A) (by C-G formula based on SCr of 1.93 mg/dL (H)). Liver Function Tests: Recent Labs  Lab 10/11/17 1049 10/11/17 1208  AST 20 22  ALT 17 21  ALKPHOS  --  69  BILITOT 0.2 0.3  PROT 7.2 7.6  ALBUMIN  --  3.4*   No results for input(s): LIPASE, AMYLASE in the last 168 hours. No results for input(s): AMMONIA in the last 168 hours. Coagulation Profile: No results for input(s): INR, PROTIME in the last 168 hours. Cardiac Enzymes: No results for input(s): CKTOTAL, CKMB, CKMBINDEX, TROPONINI in the last 168 hours. BNP (last 3 results) No results for input(s):  PROBNP in the last 8760 hours. HbA1C: No results for input(s): HGBA1C in the last 72 hours. CBG: No results for input(s): GLUCAP in the last 168 hours. Lipid Profile: No results for input(s): CHOL, HDL, LDLCALC, TRIG, CHOLHDL, LDLDIRECT in the last 72 hours. Thyroid Function Tests: No results for input(s): TSH, T4TOTAL, FREET4, T3FREE, THYROIDAB in the last 72 hours. Anemia Panel: No results for input(s): VITAMINB12, FOLATE, FERRITIN, TIBC, IRON, RETICCTPCT in the last 72 hours. Urine analysis: No results found for: COLORURINE, APPEARANCEUR, LABSPEC, PHURINE, GLUCOSEU, HGBUR, BILIRUBINUR, KETONESUR, PROTEINUR, UROBILINOGEN, NITRITE, LEUKOCYTESUR  Creatinine Clearance: Estimated Creatinine Clearance: 42.3 mL/min (A) (by C-G formula based on SCr of 1.93 mg/dL (H)).  Sepsis Labs: @LABRCNTIP (procalcitonin:4,lacticidven:4) )No results found for this or any previous visit (from the past 240 hour(s)).   Radiological Exams on Admission: Dg Finger Index Left  Result Date: 10/11/2017 CLINICAL DATA:  The patient suffered a laceration of the left index finger on a razor blade 1 week ago. Pain, redness and swelling. Initial encounter. EXAM: LEFT INDEX FINGER 2+V COMPARISON:  None. FINDINGS: Soft tissues of the index finger are swollen. There is bony destructive change throughout the distal phalanx consistent with osteomyelitis. No radiopaque foreign body is identified. IMPRESSION: Bony destructive change throughout the distal phalanx of the index finger consistent with osteomyelitis. Associated soft tissue swelling is noted. Electronically Signed   By: Inge Rise M.D.   On: 10/11/2017 12:51    EKG: pending at time of admission  Assessment/Plan Principal Problem:   Finger infection Active Problems:   Asymptomatic HIV infection ([place])   Diabetes mellitus type 2 with complications ([place])   CVA (cerebral vascular accident) ([place])   Tobacco abuse   Hyperlipidemia associated with type 2 diabetes  mellitus ([place])   Hypertension   Osteomyelitis ([place])   CKD (chronic kidney disease)   #1. Finger infection/osteomyelitis. X-ray notes bony destructive changes throughout distal phalanx of index finger consistent with osteomyelitis. Evaluated by orthopedic surgery plan for surgical intervention this afternoon. He's afebrile hemodynamically stable and nontoxic appearing mild leukocytosis  lactic acid pending -admit to MedSurg -Follow blood  cultures -obtain chest x-ray EKG -Obtain INR -Continue vancomycin and Zosyn -IV fluids -Nothing by mouth -pain management -Defer further management to ortho  #2. Acute kidney disease stage III. Creatinine 1.9. Chart review indicates this is close to his baseline. -Hold nephrotoxins -Gentle IV fluids -monitor urine output -Recheck in the morning  #3. Diabetes. Serum glucose 101 on admission. Home medications include Lantus and metformin and januvia -hold metformin -Continue Lantus at a lower dose -Obtain a hemoglobin A1c -Sliding scale for one for optimal control  #4. Hypertension. Pressure, high end of normal in the emergency department. Home medications include losartan and hydrochlorothiazide -Hold losartan and hydrochlorothiazide for now -. Hydralazine when necessary -Resume home regimen as indicated  #5. HIV symptomatic.chart review indicates patient has a history of HIV since June 2005. He was at infection control office today. They referred him to ED for wound infection -CD4 -HIV 1 RNA -continue home meds  6. Tobacco use -Cessation counseling offered  7. Drug abuse. Patient states he used cocaine this morning -UDS   DVT prophylaxis: scd  Code Status: full  Family Communication: none  Disposition Plan: home  Consults called: [facility]  Admission status: inpatient    Radene Gunning MD [facility]  If 7PM-7AM, please contact night-coverage [email or web address] Password [facility]  10/11/2017, 5:49 PM

## 2017-10-11 NOTE — Progress Notes (Addendum)
Pharmacy Antibiotic Note  Donald Ross is a 57 y.o. male admitted on 10/11/2017 with L index finger chronic infection about the distal phalanx w/ radiographic evidence of osteomyelitis. Ortho planning amputation tonight. Pharmacy has been consulted for vancomycin/zosyn dosing. Afeb, WBC 11.2 stable. SCr down to 1.93, CrCl~40-45. 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  Plan: Zosyn 3.375g IV (50min infusion) x1; then 3.375g IV q8h (4h infusion) Vancomycin 1500mg  IV x1; then 750mg  IV q12h Monitor clinical progress, c/s, renal function F/u de-escalation plan/LOT post-op, vancomycin trough as indicated    Temp (24hrs), Avg:98 F (36.7 C), Min:97.9 F (36.6 C), Max:98.2 F (36.8 C)  Recent Labs  Lab 10/11/17 1049 10/11/17 1208 10/11/17 1651  WBC 9.7 11.2* 11.2*  CREATININE 2.04* 1.93*  --     Estimated Creatinine Clearance: 42.3 mL/min (A) (by C-G formula based on SCr of 1.93 mg/dL (H)).    Allergies  Allergen Reactions  . Clonazepam Swelling    Antimicrobials this admission: 4/15 vancomycin >>  4/15 zosyn >>   Dose adjustments this admission:   Microbiology results:   Elicia Lamp, PharmD, BCPS Clinical Pharmacist 10/11/2017 5:46 PM

## 2017-10-11 NOTE — Assessment & Plan Note (Signed)
He states he has been taking his meds well Will check his labs today.  vax- he needs Hep A and B.  Given urgency of his finger, will defer to next visit.   Will see him back in 2-3 months.

## 2017-10-11 NOTE — Op Note (Signed)
10/11/2017  9:15 PM  PATIENT:  Donald Ross  57 y.o. male  PRE-OPERATIVE DIAGNOSIS:  L IF P3 osteomyelitis  POST-OPERATIVE DIAGNOSIS:  Same  PROCEDURE:  L IF amputation through distal P2  SURGEON: Rayvon Char. Grandville Silos, MD  PHYSICIAN ASSISTANT: none  ANESTHESIA:  general  SPECIMENS:  Swabs to micro, specimen to pathology  DRAINS:   None  EBL:  less than 50 mL  PREOPERATIVE INDICATIONS:  [name] is a  57 y.o. male with deep L IF infection, in setting of HIV dz and DM  The risks benefits and alternatives were discussed with the patient preoperatively including but not limited to the risks of infection, bleeding, nerve injury, cardiopulmonary complications, the need for revision surgery, among others, and the patient verbalized understanding and consented to proceed.  OPERATIVE IMPLANTS: none  OPERATIVE PROCEDURE:  After receiving antibiotics in the emergency department, the patient was escorted to the operative theatre and placed in a supine position.  General anesthesia was administered.  A surgical "time-out" was performed during which the planned procedure, proposed operative site, and the correct patient identity were compared to the operative consent and agreement confirmed by the circulating nurse according to current facility policy.  Following application of a tourniquet to the operative extremity, the exposed skin was pre-scrubbed with a Hibiclens scrub brush before being formally prepped with Chloraprep and draped in the usual sterile fashion.  The limb was exsanguinated with an Esmarch bandage (not exsanguinating the index finger itself) and the tourniquet inflated to approximately 167mmHg higher than systolic BP.  Half percent plain Marcaine was instilled at the base of the digit for postoperative pain control.  The amputation flaps were drawn, to allow for excision of the entire nail matrix, as a typical fishmouth incision, cheating slightly dorsally with a longer  palmar flap.  The skin incision was made sharply with a scalpel.  The extensor tendon was divided, the DIP capsule was divided, and the specimen was thus removed after the flexor tendon divided.  The volar plate was removed.  The flexor tendon was allowed to retract.  Cultures were obtained for microbiology from the specimen.  P2 was transected with a bone cutter at the base of the condyles.  The edges of the bone were then smoothed with a rongeur.  The wound was copiously irrigated.  There did not appear to be any gross purulence.  The tourniquet was released and blood flow ultimately resumed, but was not brisk or extremely vigorous.  The flaps were then reapproximated with interrupted 4-0 Vicryl Rapide suture placed in simple sutures.  A light dressing was applied, looping around the wrist to keep from falling off, and he was awakened and taken to the recovery room in stable condition, breathing spontaneously.  DISPOSITION: He will be returned to the floor for ongoing inpatient care.  Antibiotic plan per primary service/infectious disease.  He will need to follow-up with me in 2 weeks to check the integrity of the amputation closure wound and range of motion/need for therapy.

## 2017-10-11 NOTE — Anesthesia Postprocedure Evaluation (Signed)
Anesthesia Post Note  Patient: [name]  Procedure(s) Performed: LEFT INDEX FINGER AMPUTATION (Left )     Patient location during evaluation: PACU Anesthesia Type: General Level of consciousness: awake Pain management: pain level controlled Vital Signs Assessment: post-procedure vital signs reviewed and stable Respiratory status: spontaneous breathing, nonlabored ventilation, respiratory function stable and patient connected to nasal cannula oxygen Cardiovascular status: blood pressure returned to baseline and stable Postop Assessment: no apparent nausea or vomiting Anesthetic complications: no    Last Vitals:  Vitals:   10/11/17 2245 10/11/17 2258  BP:  (!) 136/92  Pulse:  96  Resp:    Temp:  36.8 C  SpO2: 99% 100%    Last Pain:  Vitals:   10/11/17 2258  TempSrc: Oral  PainSc:                  Ryan P Ellender

## 2017-10-11 NOTE — ED Triage Notes (Signed)
Pt states he cut his left index finger on a rusty nail last week. He reports hx of diabetes. Left index finger significantly swollen. Minimal drainage noted.

## 2017-10-11 NOTE — Anesthesia Procedure Notes (Signed)
Procedure Name: Intubation Date/Time: 10/11/2017 8:41 PM Performed by: Oletta Lamas, CRNA Pre-anesthesia Checklist: Patient identified, Emergency Drugs available, Suction available and Patient being monitored Patient Re-evaluated:Patient Re-evaluated prior to induction Oxygen Delivery Method: Circle System Utilized Preoxygenation: Pre-oxygenation with 100% oxygen Induction Type: IV induction Ventilation: Mask ventilation without difficulty Laryngoscope Size: Miller and 3 Grade View: Grade I Tube type: Oral Number of attempts: 1 Airway Equipment and Method: Stylet Placement Confirmation: ETT inserted through vocal cords under direct vision,  positive ETCO2 and breath sounds checked- equal and bilateral Secured at: 22 cm Tube secured with: Tape Dental Injury: Teeth and Oropharynx as per pre-operative assessment

## 2017-10-11 NOTE — Consult Note (Signed)
ORTHOPAEDIC CONSULTATION HISTORY & PHYSICAL REQUESTING PHYSICIAN: Pattricia Boss, MD  Chief Complaint: left index finger osteomyelitis  HPI: Donald Ross is a 57 y.o. male who presented to the emergency department with concerns of his left index finger.  He has a history of HIV disease and diabetes, who presented for evaluation of swollen and painful left index fingertip, with active drainage.  He reports to me that his finger last look normal just last week, and that he injured it with a rusty piece of metal, with a now assuming its present condition.  Past Medical History:  Diagnosis Date  . Allergy   . Anxiety   . Depression   . Diabetes mellitus without complication ([place])   . GERD (gastroesophageal reflux disease)   . HIV infection ([place])   . Hypertension   . Kidney stones 2016  . Numbness   . Pain   . Stroke [facility]) 10/07/2015   Past Surgical History:  Procedure Laterality Date  . EYE SURGERY Bilateral 1968  . LITHOTRIPSY  2016  . TENDON REPAIR Left 2016   thumb   Social History   Socioeconomic History  . Marital status: Legally Separated    Spouse name: Not on file  . Number of children: 2  . Years of education: GED  . Highest education level: Not on file  Occupational History  . Occupation: Unemployed  Social Needs  . Financial resource strain: Not on file  . Food insecurity:    Worry: Not on file    Inability: Not on file  . Transportation needs:    Medical: Not on file    Non-medical: Not on file  Tobacco Use  . Smoking status: Current Every Day Smoker    Packs/day: 0.50    Years: 39.00    Pack years: 19.50    Types: Cigarettes    Start date: 04/03/1977  . Smokeless tobacco: Never Used  Substance and Sexual Activity  . Alcohol use: No  . Drug use: No  . Sexual activity: Yes    Partners: Female    Birth control/protection: None    Comment: condomds given  Lifestyle  . Physical activity:    Days per week: Not on file    Minutes per session: Not  on file  . Stress: Not on file  Relationships  . Social connections:    Talks on phone: Not on file    Gets together: Not on file    Attends religious service: Not on file    Active member of club or organization: Not on file    Attends meetings of clubs or organizations: Not on file    Relationship status: Not on file  Other Topics Concern  . Not on file  Social History Narrative   Lives at home with fiancee.   Right-handed.   Drinks 3-liter of soda every three days.   Family History  Problem Relation Age of Onset  . CVA Mother        3 strokes  . Alzheimer's disease Mother   . Colon cancer Neg Hx    Allergies  Allergen Reactions  . Clonazepam Swelling   Prior to Admission medications   Medication Sig Start Date End Date Taking? Authorizing Provider  abacavir-dolutegravir-lamiVUDine (TRIUMEQ) 600-50-300 MG tablet Take 1 tablet by mouth daily. 04/12/17   Campbell Riches, MD  aspirin EC 81 MG tablet Take 1 tablet (81 mg total) by mouth daily. Patient not taking: Reported on 10/11/2017 04/03/16   Campbell Riches, MD  atorvastatin (LIPITOR) 10 MG tablet Take 10 mg by mouth daily. 09/16/16 06/15/18  [provider]  cyclobenzaprine (FLEXERIL) 10 MG tablet Take 10 mg by mouth at bedtime.    [provider]  fenofibrate micronized (LOFIBRA) 134 MG capsule Take 134 mg by mouth daily before breakfast. 03/09/16   [provider]  gabapentin (NEURONTIN) 800 MG tablet Take 800 mg by mouth 3 (three) times daily. 03/09/16   [provider]  HYDROcodone-acetaminophen (NORCO/VICODIN) 5-325 MG tablet  03/26/17   [provider]  Insulin Glargine (LANTUS SOLOSTAR) 100 UNIT/ML Solostar Pen Inject 48 Units into the skin.  05/04/16   [provider]  losartan-hydrochlorothiazide (HYZAAR) 50-12.5 MG tablet Take 1 tablet by mouth daily. 03/09/16   [provider]  metFORMIN (GLUCOPHAGE) 1000 MG tablet Take 1,000 mg by mouth 2 (two) times  daily with a meal. 03/09/16   [provider]  methocarbamol (ROBAXIN) 500 MG tablet Take 1 tablet (500 mg total) by mouth every 8 (eight) hours as needed for muscle spasms. Patient not taking: Reported on 10/11/2017 08/17/17   Davonna Belling, MD  nortriptyline (PAMELOR) 25 MG capsule Take 2 capsules (50 mg total) by mouth at bedtime. Patient not taking: Reported on 04/12/2017 03/09/17   Marcial Pacas, MD  omeprazole (PRILOSEC) 40 MG capsule Take 40 mg by mouth daily. 03/09/16   [provider]  sitaGLIPtin (JANUVIA) 50 MG tablet Take 50 mg by mouth. 05/04/16   [provider]  sulfamethoxazole-trimethoprim (BACTRIM) 400-80 MG tablet Take 1 tablet by mouth 2 (two) times daily. 10/11/17   Campbell Riches, MD  topiramate (TOPAMAX) 25 MG tablet Take 25 mg by mouth daily. 09/16/16   [provider]  traMADol (ULTRAM) 50 MG tablet Take by mouth 2 (two) times daily.    [provider]   Dg Finger Index Left  Result Date: 10/11/2017 CLINICAL DATA:  The patient suffered a laceration of the left index finger on a razor blade 1 week ago. Pain, redness and swelling. Initial encounter. EXAM: LEFT INDEX FINGER 2+V COMPARISON:  None. FINDINGS: Soft tissues of the index finger are swollen. There is bony destructive change throughout the distal phalanx consistent with osteomyelitis. No radiopaque foreign body is identified. IMPRESSION: Bony destructive change throughout the distal phalanx of the index finger consistent with osteomyelitis. Associated soft tissue swelling is noted. Electronically Signed   By: Inge Rise M.D.   On: 10/11/2017 12:51    Positive ROS: All other systems have been reviewed and were otherwise negative with the exception of those mentioned in the HPI and as above.  Physical Exam: Vitals: Refer to EMR. Constitutional:  WD, WN, NAD HEENT:  NCAT, EOMI Neuro/Psych:  Alert & oriented to person, place, and time; appropriate mood &  affect Lymphatic: No generalized extremity edema or lymphadenopathy Extremities / MSK:  The extremities are normal with respect to appearance, ranges of motion, joint stability, muscle strength/tone, sensation, & perfusion except as otherwise noted:  Left index finger with pale skin changes, peeling skin, bulbously enlarged at the tip, with some scant expressible fluid.  The remainder of the digit from the DIP flexion crease proximally is fairly normal, perhaps slightly swollen.  Flexor and extensor tendons appear to be intact.  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  I recommend amputation through the middle phalanx, which will hopefully  provide for enough soft tissue coverage of relatively uninvolved soft tissues.  He has been appropriately nothing by mouth, we will plan to proceed tonight.  He will likely need at least a few days of antibiotic treatment in the hospital before being discharged postop.  Rayvon Char Grandville Silos, [address], [place]  [address] Office: [phone] Mobile: [phone]  10/11/2017, 5:11 PM

## 2017-10-11 NOTE — Transfer of Care (Signed)
Immediate Anesthesia Transfer of Care Note  Patient: Donald Ross  Procedure(s) Performed: LEFT INDEX FINGER AMPUTATION (Left )  Patient Location: PACU  Anesthesia Type:General  Level of Consciousness: drowsy and patient cooperative  Airway & Oxygen Therapy: Patient Spontanous Breathing  Post-op Assessment: Report given to RN and Post -op Vital signs reviewed and stable  Post vital signs: Reviewed and stable  Last Vitals:  Vitals Value Taken Time  BP 115/74 10/11/2017  9:21 PM  Temp    Pulse    Resp 15 10/11/2017  9:23 PM  SpO2    Vitals shown include unvalidated device data.  Last Pain:  Vitals:   10/11/17 1316  TempSrc: Oral  PainSc:          Complications: No apparent anesthesia complications

## 2017-10-11 NOTE — Progress Notes (Signed)
L IF amputation through P2 neck 10-11-17  -Antibiotic plan per primary service/infectious disease.   -Dressing to remain intact until follow-up with me for surgical issues. -He will need to follow-up with me in 2 weeks to check the integrity of the amputation closure wound and range of motion/need for therapy. -Please feel free to call with any surgical concerns as they might arise.  Micheline Rough, MD Hand Surgery Office [phone]

## 2017-10-11 NOTE — ED Provider Notes (Signed)
[facility] EMERGENCY DEPARTMENT Provider Note   CSN: [id number] Arrival date & time: 10/11/17  1112     History   Chief Complaint Chief Complaint  Patient presents with  . Finger Injury    HPI Donald Ross is a 57 y.o. male.  HPI  57 year old male history of HIV and diabetes presents today with left second digit pain and swelling after injury last week.  He states he cut the tip of the finger with something metal and rested.  He reports he has had increasing swelling and pain.  He reports that he took part of the fingernail off today.  Denies any fever, redness, or streaking.  He has not been treated for this.  He was seen in HIV clinic today for a standing appointment and was sent to the ED for further evaluation.  Past Medical History:  Diagnosis Date  . Allergy   . Anxiety   . Depression   . Diabetes mellitus without complication ([place])   . GERD (gastroesophageal reflux disease)   . HIV infection ([place])   . Hypertension   . Kidney stones 2016  . Numbness   . Pain   . Stroke [facility]) 10/07/2015    Patient Active Problem List   Diagnosis Date Noted  . Abscess of finger of left hand 10/11/2017  . Finger infection 10/11/2017  . Osteomyelitis ([place]) 10/11/2017  . CKD (chronic kidney disease) 10/11/2017  . Hypertension 04/12/2017  . Pain 03/09/2017  . Neuropathy due to secondary diabetes ([place]) 08/05/2016  . Hyperlipidemia associated with type 2 diabetes mellitus ([place]) 08/05/2016  . Asymptomatic HIV infection ([place]) 04/03/2016  . Diabetes mellitus type 2 with complications ([place]) 62/13/0865  . CVA (cerebral vascular accident) ([place]) 04/03/2016  . Tobacco abuse 04/03/2016    Past Surgical History:  Procedure Laterality Date  . EYE SURGERY Bilateral 1968  . LITHOTRIPSY  2016  . TENDON REPAIR Left 2016   thumb        Home Medications    Prior to Admission medications   Medication Sig Start Date End Date Taking? Authorizing Provider    abacavir-dolutegravir-lamiVUDine (TRIUMEQ) 600-50-300 MG tablet Take 1 tablet by mouth daily. 04/12/17   Campbell Riches, MD  aspirin EC 81 MG tablet Take 1 tablet (81 mg total) by mouth daily. Patient not taking: Reported on 10/11/2017 04/03/16   Campbell Riches, MD  atorvastatin (LIPITOR) 10 MG tablet Take 10 mg by mouth daily. 09/16/16 06/15/18  [provider]  cyclobenzaprine (FLEXERIL) 10 MG tablet Take 10 mg by mouth at bedtime.    [provider]  fenofibrate micronized (LOFIBRA) 134 MG capsule Take 134 mg by mouth daily before breakfast. 03/09/16   [provider]  gabapentin (NEURONTIN) 800 MG tablet Take 800 mg by mouth 3 (three) times daily. 03/09/16   [provider]  HYDROcodone-acetaminophen (NORCO/VICODIN) 5-325 MG tablet  03/26/17   [provider]  Insulin Glargine (LANTUS SOLOSTAR) 100 UNIT/ML Solostar Pen Inject 48 Units into the skin.  05/04/16   [provider]  losartan-hydrochlorothiazide (HYZAAR) 50-12.5 MG tablet Take 1 tablet by mouth daily. 03/09/16   [provider]  metFORMIN (GLUCOPHAGE) 1000 MG tablet Take 1,000 mg by mouth 2 (two) times daily with a meal. 03/09/16   [provider]  methocarbamol (ROBAXIN) 500 MG tablet Take 1 tablet (500 mg total) by mouth every 8 (eight) hours as needed for muscle spasms. Patient not taking: Reported on 10/11/2017 08/17/17   Davonna Belling, MD  nortriptyline (PAMELOR) 25 MG capsule Take 2 capsules (50 mg total) by mouth at bedtime. Patient not taking: Reported on 04/12/2017 03/09/17   Marcial Pacas, MD  omeprazole (PRILOSEC) 40 MG capsule Take 40 mg by mouth daily. 03/09/16   [provider]  sitaGLIPtin (JANUVIA) 50 MG tablet Take 50 mg by mouth. 05/04/16   [provider]  sulfamethoxazole-trimethoprim (BACTRIM) 400-80 MG tablet Take 1 tablet by mouth 2 (two) times daily. 10/11/17   Campbell Riches, MD  topiramate (TOPAMAX) 25 MG tablet Take 25  mg by mouth daily. 09/16/16   [provider]  traMADol (ULTRAM) 50 MG tablet Take by mouth 2 (two) times daily.    [provider]    Family History Family History  Problem Relation Age of Onset  . CVA Mother        3 strokes  . Alzheimer's disease Mother   . Colon cancer Neg Hx     Social History Social History   Tobacco Use  . Smoking status: Current Every Day Smoker    Packs/day: 0.50    Years: 39.00    Pack years: 19.50    Types: Cigarettes    Start date: 04/03/1977  . Smokeless tobacco: Never Used  Substance Use Topics  . Alcohol use: No  . Drug use: No     Allergies   Clonazepam   Review of Systems Review of Systems  Constitutional: Negative for chills and fever.  HENT: Negative.   Eyes: Negative.   Respiratory: Negative.   Cardiovascular: Negative.   Gastrointestinal: Negative.   Endocrine: Negative.   Genitourinary: Negative.   Skin: Positive for wound.  Neurological: Negative.   Hematological: Negative.   All other systems reviewed and are negative.    Physical Exam Updated Vital Signs BP (!) 138/98 (BP Location: Right Arm)   Pulse 81   Temp 98.2 F (36.8 C) (Oral)   Resp 20   SpO2 100%   Physical Exam  Constitutional: He appears well-developed and well-nourished. No distress.  HENT:  Head: Normocephalic and atraumatic.  Eyes: Pupils are equal, round, and reactive to light. EOM are normal.  Neck: Normal range of motion.  Cardiovascular: Normal rate and regular rhythm.  Pulmonary/Chest: Effort normal.  Abdominal: Soft.  Musculoskeletal:       Hands: Diffuse swelling with the finger held partially in flexion inability to completely extend or flex the finger.  Appears to be some discharge from wound at nail bed.  Nursing note and vitals reviewed.    ED Treatments / Results  Labs (all labs ordered are listed, but only abnormal results are displayed) Labs Reviewed  COMPREHENSIVE METABOLIC PANEL - Abnormal; Notable  for the following components:      Result Value   Glucose, Bld 101 (*)    BUN 34 (*)    Creatinine, Ser 1.93 (*)    Albumin 3.4 (*)    GFR calc non Af Amer 38 (*)    GFR calc Af Amer 44 (*)    All other components within normal limits  CBC WITH DIFFERENTIAL/PLATELET - Abnormal; Notable for the following components:   WBC 11.2 (*)    Hemoglobin 12.3 (*)    HCT 38.4 (*)    RDW 16.3 (*)    All other components within normal limits  CBC WITH DIFFERENTIAL/PLATELET - Abnormal; Notable for the following components:   WBC 11.2 (*)    Hemoglobin 12.6 (*)    RDW 16.0 (*)    All other  components within normal limits  CULTURE, BLOOD (ROUTINE X 2)  CULTURE, BLOOD (ROUTINE X 2)  PROTIME-INR  I-STAT CG4 LACTIC ACID, ED    EKG None  Radiology Dg Finger Index Left  Result Date: 10/11/2017 CLINICAL DATA:  The patient suffered a laceration of the left index finger on a razor blade 1 week ago. Pain, redness and swelling. Initial encounter. EXAM: LEFT INDEX FINGER 2+V COMPARISON:  None. FINDINGS: Soft tissues of the index finger are swollen. There is bony destructive change throughout the distal phalanx consistent with osteomyelitis. No radiopaque foreign body is identified. IMPRESSION: Bony destructive change throughout the distal phalanx of the index finger consistent with osteomyelitis. Associated soft tissue swelling is noted. Electronically Signed   By: Inge Rise M.D.   On: 10/11/2017 12:51    Procedures Procedures (including critical care time)  Medications Ordered in ED Medications  sodium chloride 0.9 % bolus 500 mL (has no administration in time range)  piperacillin-tazobactam (ZOSYN) IVPB 3.375 g (has no administration in time range)  vancomycin (VANCOCIN) IVPB 1000 mg/200 mL premix (has no administration in time range)  sodium chloride 0.9 % bolus 500 mL (has no administration in time range)  lidocaine (XYLOCAINE) 2 % (with pres) injection 200 mg (has no administration in  time range)     Initial Impression / Assessment and Plan / ED Course  I have reviewed the triage vital signs and the nursing notes.  Pertinent labs & imaging results that were available during my care of the patient were reviewed by me and considered in my medical decision making (see chart for details).    Finger injury with subsequent infection in patient with diabetes and HIV.  Patient had IV started and IV antibiotics are started here in the ED.  X-rays were obtained and reviewed x-rays reveal probable osteomyelitis with soft tissue swelling.  Hand surgery, Dr. Grandville Silos, consult contacted.  Patient is n.p.o. and is continued to be n.p.o. here in ED.  Dr. Grandville Silos is evaluating him for surgery tonight versus within the next several days depending upon patient's status.  Medicine consulted for admission and management of patient's HIV and diabetes.  Leukocytosis consistent with infection, patient was initially mildly tachycardic, no fever, and blood pressure is elevated.  No indication that patient is septic or has systemic infection at this time. Final Clinical Impressions(s) / ED Diagnoses   Final diagnoses:  Finger infection  Asymptomatic HIV infection ([place])  Type 2 diabetes mellitus with microalbuminuria, unspecified whether long term insulin use [facility])    ED Discharge Orders    None       Pattricia Boss, MD 10/11/17 1752

## 2017-10-12 ENCOUNTER — Encounter (HOSPITAL_COMMUNITY): Payer: Self-pay | Admitting: Orthopedic Surgery

## 2017-10-12 DIAGNOSIS — F17210 Nicotine dependence, cigarettes, uncomplicated: Secondary | ICD-10-CM

## 2017-10-12 DIAGNOSIS — Z823 Family history of stroke: Secondary | ICD-10-CM

## 2017-10-12 DIAGNOSIS — E1122 Type 2 diabetes mellitus with diabetic chronic kidney disease: Secondary | ICD-10-CM

## 2017-10-12 DIAGNOSIS — Z21 Asymptomatic human immunodeficiency virus [HIV] infection status: Secondary | ICD-10-CM

## 2017-10-12 DIAGNOSIS — L02512 Cutaneous abscess of left hand: Secondary | ICD-10-CM

## 2017-10-12 DIAGNOSIS — E1140 Type 2 diabetes mellitus with diabetic neuropathy, unspecified: Secondary | ICD-10-CM

## 2017-10-12 DIAGNOSIS — Z8673 Personal history of transient ischemic attack (TIA), and cerebral infarction without residual deficits: Secondary | ICD-10-CM

## 2017-10-12 DIAGNOSIS — N182 Chronic kidney disease, stage 2 (mild): Secondary | ICD-10-CM

## 2017-10-12 DIAGNOSIS — Z89022 Acquired absence of left finger(s): Secondary | ICD-10-CM

## 2017-10-12 LAB — CBC
HCT: 37.5 % — ABNORMAL LOW (ref 38.5–50.0)
HCT: 34.1 % — ABNORMAL LOW (ref 39.0–52.0)
HEMOGLOBIN: 12.2 g/dL — AB (ref 13.2–17.1)
HEMOGLOBIN: 10.9 g/dL — AB (ref 13.0–17.0)
MCH: 27.2 pg (ref 27.0–33.0)
MCH: 27.7 pg (ref 26.0–34.0)
MCHC: 32.5 g/dL (ref 32.0–36.0)
MCHC: 32 g/dL (ref 30.0–36.0)
MCV: 83.7 fL (ref 80.0–100.0)
MCV: 86.8 fL (ref 78.0–100.0)
MPV: 10.2 fL (ref 7.5–12.5)
PLATELETS: 407 10*3/uL — AB (ref 140–400)
Platelets: 367 10*3/uL (ref 150–400)
RBC: 4.48 10*6/uL (ref 4.20–5.80)
RBC: 3.93 MIL/uL — ABNORMAL LOW (ref 4.22–5.81)
RDW: 15.3 % — ABNORMAL HIGH (ref 11.0–15.0)
RDW: 15.9 % — AB (ref 11.5–15.5)
WBC: 9.7 10*3/uL (ref 3.8–10.8)
WBC: 9 10*3/uL (ref 4.0–10.5)

## 2017-10-12 LAB — BASIC METABOLIC PANEL
ANION GAP: 6 (ref 5–15)
BUN: 31 mg/dL — AB (ref 6–20)
CALCIUM: 8.4 mg/dL — AB (ref 8.9–10.3)
CO2: 22 mmol/L (ref 22–32)
Chloride: 106 mmol/L (ref 101–111)
Creatinine, Ser: 1.72 mg/dL — ABNORMAL HIGH (ref 0.61–1.24)
GFR calc Af Amer: 50 mL/min — ABNORMAL LOW (ref >60)
GFR, EST NON AFRICAN AMERICAN: 43 mL/min — AB (ref >60)
GLUCOSE: 267 mg/dL — AB (ref 65–99)
Potassium: 4.6 mmol/L (ref 3.5–5.1)
Sodium: 134 mmol/L — ABNORMAL LOW (ref 135–145)

## 2017-10-12 LAB — COMPREHENSIVE METABOLIC PANEL
AG RATIO: 1.3 (calc) (ref 1.0–2.5)
ALBUMIN MSPROF: 4 g/dL (ref 3.6–5.1)
ALKALINE PHOSPHATASE (APISO): 69 U/L (ref 40–115)
ALT: 17 U/L (ref 9–46)
AST: 20 U/L (ref 10–35)
BILIRUBIN TOTAL: 0.2 mg/dL (ref 0.2–1.2)
BUN/Creatinine Ratio: 18 (calc) (ref 6–22)
BUN: 36 mg/dL — AB (ref 7–25)
CO2: 24 mmol/L (ref 20–32)
Calcium: 9.6 mg/dL (ref 8.6–10.3)
Chloride: 106 mmol/L (ref 98–110)
Creat: 2.04 mg/dL — ABNORMAL HIGH (ref 0.70–1.33)
GLOBULIN: 3.2 g/dL (ref 1.9–3.7)
GLUCOSE: 106 mg/dL — AB (ref 65–99)
Potassium: 4.4 mmol/L (ref 3.5–5.3)
Sodium: 138 mmol/L (ref 135–146)
Total Protein: 7.2 g/dL (ref 6.1–8.1)

## 2017-10-12 LAB — GLUCOSE, CAPILLARY
GLUCOSE-CAPILLARY: 228 mg/dL — AB (ref 65–99)
GLUCOSE-CAPILLARY: 175 mg/dL — AB (ref 65–99)
GLUCOSE-CAPILLARY: 227 mg/dL — AB (ref 65–99)
Glucose-Capillary: 319 mg/dL — ABNORMAL HIGH (ref 65–99)

## 2017-10-12 LAB — RPR: RPR: NONREACTIVE

## 2017-10-12 LAB — T-HELPER CELLS (CD4) COUNT (NOT AT ARMC)
CD4 T CELL ABS: 890 /uL (ref 400–2700)
CD4 T CELL HELPER: 48 % (ref 33–55)

## 2017-10-12 LAB — SEDIMENTATION RATE: Sed Rate: 101 mm/h — ABNORMAL HIGH (ref 0–20)

## 2017-10-12 LAB — C-REACTIVE PROTEIN: CRP: 24.4 mg/L — ABNORMAL HIGH (ref <8.0)

## 2017-10-12 MED ORDER — INSULIN GLARGINE 100 UNIT/ML ~~LOC~~ SOLN
10.0000 [IU] | Freq: Every day | SUBCUTANEOUS | Status: DC
  Administered 2017-10-12 – 2017-10-13 (×2): 10 [IU] via SUBCUTANEOUS
  Filled 2017-10-12 (×3): qty 0.1

## 2017-10-12 MED ORDER — ENOXAPARIN SODIUM 40 MG/0.4ML ~~LOC~~ SOLN
40.0000 mg | SUBCUTANEOUS | Status: DC
  Administered 2017-10-12 – 2017-10-13 (×2): 40 mg via SUBCUTANEOUS
  Filled 2017-10-12 (×2): qty 0.4

## 2017-10-12 MED ORDER — CEFTRIAXONE SODIUM 2 G IJ SOLR
2.0000 g | INTRAMUSCULAR | Status: DC
  Administered 2017-10-12: 2 g via INTRAVENOUS
  Filled 2017-10-12 (×2): qty 20

## 2017-10-12 MED ORDER — ENOXAPARIN SODIUM 30 MG/0.3ML ~~LOC~~ SOLN: 30.0000 mg | SUBCUTANEOUS | Status: DC

## 2017-10-12 NOTE — Progress Notes (Signed)
PROGRESS NOTE    Donald Ross  ELF:[id number] DOB: Jun 23, 1961 DOA: 10/11/2017 PCP: Bernerd Limbo, MD    Brief Narrative: 57 year old gentleman who presented with left hand index finger pain and edema. He does have the significant past medical history of HIV on anti-retroviral therapy, undetectable viral load, type 2 diabetes mellitus, hypertension, dyslipidemia and history of CVA April 2017. About 7 days ago he cut his left hand index finger with a rusty metal, but not until 3 days ago he started experiencing local pain and edema. The pain is moderate to severe in intensity, it is constant, worse with movement, no improving factors, no radiation, associated with local purulent drainage.   Digit x-ray with bony destructive change throughout the distal phalanx of the index finger consistent with osteomyelitis.   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 On IV vancomycin and zosyn.  Follow ID recommendation for antibiotics.  Follow culture results.    Type 2 DM;  Continue with lantus.  SSI.   History of CVA;  On statins.  unclear why he is not on aspirin ?   HIV; continue with antiretroviral therapy.  Dyslipidemia. Statins.   Acute on CKD stage II; cr baseline 1.9--1.7  Cr on admission at 2.  Trending down on IV fluids.   HTN; hold BP medications SBP soft.    DVT prophylaxis: Lovenox Code Status: Full code.  Family Communication: care discussed with patient  Disposition Plan: to be determine.   Consultants:   ID  Hand  sx.   Procedures:  L IF amputation through distal P2      Antimicrobials:   Vancomycin 4-15  Zosyn 4-15   Subjective: He is sleepy. Report pain is controlled.   Objective: Vitals:   10/11/17 2325 10/12/17 0130 10/12/17 0550 10/12/17 1357  BP:  113/70 107/77 119/63  Pulse:  94 96 95  Resp:  16 16 16   Temp:  98.2 F (36.8 C) 98.1 F (36.7 C) 98.2 F (36.8 C)  TempSrc:  Oral Oral Oral  SpO2:  100% 100% 95%  Weight: 75.8 kg (167 lb)     Height: 5\' 8"  (1.727 m)       Intake/Output Summary (Last 24 hours) at 10/12/2017 1506 Last data filed at 10/12/2017 1150 Gross per 24 hour  Intake 2050 ml  Output 1410 ml  Net 640 ml   Filed Weights   10/11/17 2325  Weight: 75.8 kg (167 lb)    Examination:  General exam: Appears calm and comfortable  Respiratory system: Clear to auscultation. Respiratory effort normal. Cardiovascular system: S1 & S2 heard, RRR. No JVD, murmurs, rubs, gallops or clicks. No pedal edema. Gastrointestinal system: Abdomen is nondistended, soft and nontender. No organomegaly or masses felt. Normal bowel sounds heard. Central nervous system: Alert and oriented. No focal neurological deficits. Extremities: Symmetric 5 x 5 power. Skin: left index finger with dressing.     Data Reviewed: I have personally reviewed following labs and imaging studies  CBC: Recent Labs  Lab 10/11/17 1049 10/11/17 1208 10/11/17 1651 10/12/17 0807  WBC 9.7  11.2* 11.2* 9.0  NEUTROABS  --  7.6 7.6  --   HGB 12.2* 12.3* 12.6* 10.9*  HCT 37.5* 38.4* 39.0 34.1*  MCV 83.7 87.3 86.5 86.8  PLT 407* 398 379 294   Basic Metabolic Panel: Recent Labs  Lab 10/11/17 1049 10/11/17 1208 10/12/17 0807  NA 138 138 134*  K 4.4 4.2 4.6  CL 106 106 106  CO2 24 22 22   GLUCOSE 106* 101* 267*  BUN 36* 34* 31*  CREATININE 2.04* 1.93* 1.72*  CALCIUM 9.6 9.4 8.4*   GFR: Estimated Creatinine Clearance: 47.5 mL/min (A) (by C-G formula based on SCr of 1.72 mg/dL (H)). Liver  Function Tests: Recent Labs  Lab 10/11/17 1049 10/11/17 1208  AST 20 22  ALT 17 21  ALKPHOS  --  69  BILITOT 0.2 0.3  PROT 7.2 7.6  ALBUMIN  --  3.4*   No results for input(s): LIPASE, AMYLASE in the last 168 hours. No results for input(s): AMMONIA in the last 168 hours. Coagulation Profile: Recent Labs  Lab 10/11/17 2238  INR 0.92   Cardiac Enzymes: No results for input(s): CKTOTAL, CKMB, CKMBINDEX, TROPONINI in the last 168 hours. BNP (last 3 results) No results for input(s): PROBNP in the last 8760 hours. HbA1C: No results for input(s): HGBA1C in the last 72 hours. CBG: Recent Labs  Lab 10/11/17 2004 10/11/17 2126 10/11/17 2316 10/12/17 0830 10/12/17 1155  GLUCAP 75 91 128* 228* 175*   Lipid Profile: No results for input(s): CHOL, HDL, LDLCALC, TRIG, CHOLHDL, LDLDIRECT in the last 72 hours. Thyroid Function Tests: No results for input(s): TSH, T4TOTAL, FREET4, T3FREE, THYROIDAB in the last 72 hours. Anemia Panel: No results for input(s): VITAMINB12, FOLATE, FERRITIN, TIBC, IRON, RETICCTPCT in the last 72 hours. Sepsis Labs: Recent Labs  Lab 10/11/17 1812  LATICACIDVEN 1.10    Recent Results (from the past 240 hour(s))  WOUND CULTURE     Status: Abnormal (Preliminary result)   Collection Time: 10/11/17 10:52 AM  Result Value Ref Range Status   MICRO NUMBER: [id number]  Preliminary   SPECIMEN QUALITY: ADEQUATE  Preliminary   SOURCE: FINGER  Preliminary   STATUS: PRELIMINARY  Preliminary   GRAM STAIN:   Preliminary    No white blood cells seen No epithelial cells seen No organisms seen   ISOLATE 1: Streptococcus pyogenes (A)  Preliminary    Comment: Moderate growth of Group A Streptococcus isolated Beta-hemolytic Streptococci are predictably susceptible to penicillin and other beta-lactams. Susceptibility testing not routinely performed.  Blood culture (routine x 2)     Status: None (Preliminary result)   Collection Time: 10/11/17  4:41 PM  Result Value  Ref Range Status   Specimen Description BLOOD RIGHT ANTECUBITAL  Final   Special Requests   Final    IN BOTH AEROBIC AND ANAEROBIC BOTTLES Blood Culture adequate volume   Culture   Final    NO GROWTH < 24 HOURS Performed at [facility], [place] [address], [place], [place] [address]    Report Status PENDING  Incomplete  Blood culture (routine x 2)     Status: None (Preliminary result)   Collection Time: 10/11/17  5:04 PM  Result Value Ref Range Status   Specimen Description BLOOD LEFT ANTECUBITAL  Final   Special Requests   Final    BOTTLES DRAWN AEROBIC AND ANAEROBIC Blood Culture adequate volume   Culture   Final    NO GROWTH < 24 HOURS Performed at [facility], [address] [place], [place] [address]    Report Status PENDING  Incomplete  Aerobic/Anaerobic Culture (surgical/deep wound)     Status: None (Preliminary result)   Collection Time: 10/11/17  9:11 PM  Result Value Ref Range Status   Specimen Description WOUND LEFT FINGER  Final   Special Requests LEFT INDEX FINGER AMPUTATION  Final   Gram Stain NO WBC SEEN NO ORGANISMS SEEN   Final   Culture   Final    CULTURE REINCUBATED FOR BETTER GROWTH Performed at [facility], [place] [address], [place], [place] [address]    Report Status PENDING  Incomplete         Radiology Studies: Dg Chest 1 View  Result Date: 10/11/2017 CLINICAL DATA:  Preop EXAM: CHEST  1 VIEW COMPARISON:  08/17/2017 FINDINGS: The heart size and mediastinal contours are within normal limits. Both lungs are clear. The visualized skeletal structures are unremarkable. IMPRESSION: No active disease. Electronically Signed   By: Donavan Foil M.D.   On: 10/11/2017 20:16   Dg Finger Index Left  Result Date: 10/11/2017 CLINICAL DATA:  The patient suffered a laceration of the left index finger on a razor blade 1 week ago. Pain, redness and swelling. Initial encounter. EXAM: LEFT INDEX FINGER 2+V COMPARISON:  None. FINDINGS: Soft tissues of  the index finger are swollen. There is bony destructive change throughout the distal phalanx consistent with osteomyelitis. No radiopaque foreign body is identified. IMPRESSION: Bony destructive change throughout the distal phalanx of the index finger consistent with osteomyelitis. Associated soft tissue swelling is noted. Electronically Signed   By: Inge Rise M.D.   On: 10/11/2017 12:51        Scheduled Meds: . abacavir-dolutegravir-lamiVUDine  1 tablet Oral Daily  . atorvastatin  10 mg Oral Daily  . fenofibrate  54 mg Oral Daily  . insulin aspart  0-15 Units Subcutaneous TID WC  . insulin aspart  0-5 Units Subcutaneous QHS  . insulin glargine  10 Units Subcutaneous QHS  . lidocaine  10 mL Infiltration Once  . pantoprazole  40 mg Oral Daily   Continuous Infusions: . sodium chloride 75 mL/hr at 10/12/17 1320  . lactated ringers 10 mL/hr at 10/11/17 2018  . piperacillin-tazobactam (ZOSYN)  IV Stopped (10/12/17 1150)  . vancomycin Stopped (10/12/17 0630)     LOS: 1 day    Time spent: 35 minutes.     Elmarie Shiley, MD [facility] Pager [phone]  If 7PM-7AM, please contact night-coverage [email or web address] Password [email or web address] 10/12/2017, 3:06 PM

## 2017-10-12 NOTE — Plan of Care (Signed)
  Problem: Clinical Measurements: Goal: Postoperative complications will be avoided or minimized Outcome: Progressing   Problem: Pain Management: Goal: Pain level will decrease with appropriate interventions Outcome: Progressing   

## 2017-10-12 NOTE — Progress Notes (Signed)
Inpatient Diabetes Program Recommendations  AACE/ADA: New Consensus Statement on Inpatient Glycemic Control (2015)  Target Ranges:  Prepandial:   less than 140 mg/dL      Peak postprandial:   less than 180 mg/dL (1-2 hours)      Critically ill patients:  140 - 180 mg/dL   Results for GENTRY, PILSON (MRN [id number]) as of 10/12/2017 11:09  Ref. Range 10/11/2017 20:04 10/11/2017 21:26 10/11/2017 23:16 10/12/2017 08:30  Glucose-Capillary Latest Ref Range: 65 - 99 mg/dL 75 91 128 (H) 228 (H)   Review of Glycemic Control  Diabetes history: DM2 Outpatient Diabetes medications:Lantus 48 units daily (per H&P has not taken Lantus in about 1 month), Metformin 1000 mg BID, Januvia 50 mg daily Current orders for Inpatient glycemic control: Lantus 10 units QHS, Novolog 0-15 units TID with meals, Novolog 0-5 units QHS  Inpatient Diabetes Program Recommendations: Insulin - Basal: Fasting glucose 228 mg/dl today. Noted patient received one time dose of Decadron 8 mg on 10/11/17 which is contributing to hyperglycemia. If glucose is consistently greater than 180 mg/dl (with current Lantus and Novolog correction orders), may want to consider increasing Lantus to 15 units QHS. HgbA1C: Please consider ordering an A1C to evaluate glycemic control over the past 2-3 months.  Thanks, Barnie Alderman, RN, MSN, CDE Diabetes Coordinator Inpatient Diabetes Program [phone] (Team Pager from 8am to 5pm)

## 2017-10-12 NOTE — Consult Note (Signed)
[facility] for Infectious Disease    Date of Admission:  10/11/2017   Total days of antibiotics: 0               Reason for Consult: finger abscess    Referring Provider: Regalado   Assessment: HIV+ Finger abscess DM2 with neuropathy CKD 2 (GFR 50)  Plan: 1. Will change anbx to ceftriaxone 2. Will recheck his CD4 and HIV RNA 3. Await op Cx 4. Greatly appreciate hand surgery eval 5. Greatly appreciate [facility]'s care of this pt 6. Watch his Cr 7. Diabetic control  Thank you so much for this interesting consult,  Principal Problem:   Finger infection Active Problems:   Asymptomatic HIV infection ([place])   Diabetes mellitus type 2 with complications ([place])   CVA (cerebral vascular accident) ([place])   Tobacco abuse   Hyperlipidemia associated with type 2 diabetes mellitus ([place])   Hypertension   Osteomyelitis ([place])   CKD (chronic kidney disease)   Drug abuse ([place])   . abacavir-dolutegravir-lamiVUDine  1 tablet Oral Daily  . atorvastatin  10 mg Oral Daily  . enoxaparin (LOVENOX) injection  30 mg Subcutaneous Q24H  . fenofibrate  54 mg Oral Daily  . insulin aspart  0-15 Units Subcutaneous TID WC  . insulin aspart  0-5 Units Subcutaneous QHS  . insulin glargine  10 Units Subcutaneous QHS  . lidocaine  10 mL Infiltration Once  . pantoprazole  40 mg Oral Daily    HPI: Donald Ross is a 57 y.o. male with hx of HIV+ since 11-2003. He has been doing well on his ART (last triumeq).  He was dx with DM2 around that time as well. He has prev had a CVA (09-2015).  He presented to ID clinic 4-15 with pain and swelling, d/c from his L index finger after injuring it on a rusty razor. In clinic he clearly had an abscess and was sent to hospital for further eval.  He underwent partial amputation of his finger yesterday.  He was started on vanco/zosyn.   HIV 1 RNA Quant (copies/mL)  Date Value  04/12/2017 <20 NOT DETECTED  08/05/2016 <20 NOT DETECTED  04/07/2016 46 (H)    CD4 T Cell Abs (/uL)  Date Value  10/11/2017 890  04/12/2017 1,850  08/05/2016 1,710     Review of Systems: Review of Systems  Constitutional: Negative for chills and fever.  Musculoskeletal: Positive for joint pain.  Neurological: Positive for sensory change.  Please see HPI. All other systems reviewed and negative.   Past Medical History:  Diagnosis Date  . Allergy   . Anxiety   . Depression   . Diabetes mellitus without complication ([place])   . GERD (gastroesophageal reflux disease)   . HIV infection ([place])   . Hypertension   . Kidney stones 2016  . Numbness   . Pain   . Stroke [facility]) 10/07/2015    Social History   Tobacco Use  . Smoking status: Current Every Day Smoker    Packs/day: 0.50    Years: 39.00    Pack years: 19.50    Types: Cigarettes    Start date: 04/03/1977  . Smokeless tobacco: Never Used  Substance Use Topics  . Alcohol use: No  . Drug use: No    Family History  Problem Relation Age of Onset  . CVA Mother        3 strokes  . Alzheimer's disease Mother   . Colon cancer  Neg Hx      Medications:  Scheduled: . abacavir-dolutegravir-lamiVUDine  1 tablet Oral Daily  . atorvastatin  10 mg Oral Daily  . enoxaparin (LOVENOX) injection  30 mg Subcutaneous Q24H  . fenofibrate  54 mg Oral Daily  . insulin aspart  0-15 Units Subcutaneous TID WC  . insulin aspart  0-5 Units Subcutaneous QHS  . insulin glargine  10 Units Subcutaneous QHS  . lidocaine  10 mL Infiltration Once  . pantoprazole  40 mg Oral Daily    Abtx:  Anti-infectives (From admission, onward)   Start     Dose/Rate Route Frequency Ordered Stop   10/12/17 0630  vancomycin (VANCOCIN) IVPB 750 mg/150 ml premix     750 mg 150 mL/hr over 60 Minutes Intravenous Every 12 hours 10/11/17 1753     10/12/17 0000  piperacillin-tazobactam (ZOSYN) IVPB 3.375 g     3.375 g 12.5 mL/hr over 240 Minutes Intravenous Every 8 hours 10/11/17 1753     10/11/17 1800   abacavir-dolutegravir-lamiVUDine (TRIUMEQ) 600-50-300 MG per tablet 1 tablet     1 tablet Oral Daily 10/11/17 1745     10/11/17 1800  vancomycin (VANCOCIN) 1,500 mg in sodium chloride 0.9 % 500 mL IVPB     1,500 mg 250 mL/hr over 120 Minutes Intravenous  Once 10/11/17 1752 10/11/17 2033   10/11/17 1645  piperacillin-tazobactam (ZOSYN) IVPB 3.375 g     3.375 g 100 mL/hr over 30 Minutes Intravenous  Once 10/11/17 1638 10/11/17 1844   10/11/17 1645  vancomycin (VANCOCIN) IVPB 1000 mg/200 mL premix  Status:  Discontinued     1,000 mg 200 mL/hr over 60 Minutes Intravenous  Once 10/11/17 1638 10/11/17 1752        OBJECTIVE: Blood pressure 119/63, pulse 95, temperature 98.2 F (36.8 C), temperature source Oral, resp. rate 16, height 5' 8"  (1.727 m), weight 75.8 kg (167 lb), SpO2 95 %.  Physical Exam  Constitutional: He is oriented to person, place, and time. He appears well-nourished. No distress.  HENT:  Mouth/Throat: No oropharyngeal exudate.  Eyes: Pupils are equal, round, and reactive to light. EOM are normal.  Neck: Normal range of motion. Neck supple.  Cardiovascular: Regular rhythm, normal heart sounds and intact distal pulses.  Pulmonary/Chest: Effort normal and breath sounds normal.  Abdominal: Soft. There is no tenderness.  Musculoskeletal:  L index finger stump wrapped. No proximal swelling or erythema.   Neurological: He is alert and oriented to person, place, and time. A sensory deficit is present.    Lab Results Results for orders placed or performed during the hospital encounter of 10/11/17 (from the past 48 hour(s))  Comprehensive metabolic panel     Status: Abnormal   Collection Time: 10/11/17 12:08 PM  Result Value Ref Range   Sodium 138 135 - 145 mmol/L   Potassium 4.2 3.5 - 5.1 mmol/L   Chloride 106 101 - 111 mmol/L   CO2 22 22 - 32 mmol/L   Glucose, Bld 101 (H) 65 - 99 mg/dL   BUN 34 (H) 6 - 20 mg/dL   Creatinine, Ser 1.93 (H) 0.61 - 1.24 mg/dL   Calcium  9.4 8.9 - 10.3 mg/dL   Total Protein 7.6 6.5 - 8.1 g/dL   Albumin 3.4 (L) 3.5 - 5.0 g/dL   AST 22 15 - 41 U/L   ALT 21 17 - 63 U/L   Alkaline Phosphatase 69 38 - 126 U/L   Total Bilirubin 0.3 0.3 - 1.2 mg/dL   GFR  calc non Af Amer 38 (L) >60 mL/min   GFR calc Af Amer 44 (L) >60 mL/min    Comment: (NOTE) The eGFR has been calculated using the CKD EPI equation. This calculation has not been validated in all clinical situations. eGFR's persistently <60 mL/min signify possible Chronic Kidney Disease.    Anion gap 10 5 - 15    Comment: Performed at  [address], [place], [place] [address]  CBC with Differential     Status: Abnormal   Collection Time: 10/11/17 12:08 PM  Result Value Ref Range   WBC 11.2 (H) 4.0 - 10.5 K/uL   RBC 4.40 4.22 - 5.81 MIL/uL   Hemoglobin 12.3 (L) 13.0 - 17.0 g/dL   HCT 38.4 (L) 39.0 - 52.0 %   MCV 87.3 78.0 - 100.0 fL   MCH 28.0 26.0 - 34.0 pg   MCHC 32.0 30.0 - 36.0 g/dL   RDW 16.3 (H) 11.5 - 15.5 %   Platelets 398 150 - 400 K/uL   Neutrophils Relative % 68 %   Neutro Abs 7.6 1.7 - 7.7 K/uL   Lymphocytes Relative 26 %   Lymphs Abs 3.0 0.7 - 4.0 K/uL   Monocytes Relative 5 %   Monocytes Absolute 0.5 0.1 - 1.0 K/uL   Eosinophils Relative 1 %   Eosinophils Absolute 0.1 0.0 - 0.7 K/uL   Basophils Relative 0 %   Basophils Absolute 0.0 0.0 - 0.1 K/uL    Comment: Performed at [place] [address] [place], [place] [address]  Blood culture (routine x 2)     Status: None (Preliminary result)   Collection Time: 10/11/17  4:41 PM  Result Value Ref Range   Specimen Description BLOOD RIGHT ANTECUBITAL    Special Requests      IN BOTH AEROBIC AND ANAEROBIC BOTTLES Blood Culture adequate volume   Culture      NO GROWTH < 24 HOURS Performed at [facility], [place] [address], [place], [place] [address]    Report Status PENDING   CBC with Differential/Platelet     Status: Abnormal   Collection Time: 10/11/17  4:51 PM   Result Value Ref Range   WBC 11.2 (H) 4.0 - 10.5 K/uL   RBC 4.51 4.22 - 5.81 MIL/uL   Hemoglobin 12.6 (L) 13.0 - 17.0 g/dL   HCT 39.0 39.0 - 52.0 %   MCV 86.5 78.0 - 100.0 fL   MCH 27.9 26.0 - 34.0 pg   MCHC 32.3 30.0 - 36.0 g/dL   RDW 16.0 (H) 11.5 - 15.5 %   Platelets 379 150 - 400 K/uL   Neutrophils Relative % 68 %   Neutro Abs 7.6 1.7 - 7.7 K/uL   Lymphocytes Relative 25 %   Lymphs Abs 2.8 0.7 - 4.0 K/uL   Monocytes Relative 6 %   Monocytes Absolute 0.6 0.1 - 1.0 K/uL   Eosinophils Relative 1 %   Eosinophils Absolute 0.1 0.0 - 0.7 K/uL   Basophils Relative 0 %   Basophils Absolute 0.0 0.0 - 0.1 K/uL    Comment: Performed at [facility] [address] [place], [place] [address]  Blood culture (routine x 2)     Status: None (Preliminary result)   Collection Time: 10/11/17  5:04 PM  Result Value Ref Range   Specimen Description BLOOD LEFT ANTECUBITAL    Special Requests      BOTTLES DRAWN AEROBIC AND ANAEROBIC Blood Culture adequate  volume   Culture      NO GROWTH < 24 HOURS Performed at [facility] [address] [place], [place] [address]    Report Status PENDING   I-Stat CG4 Lactic Acid, ED     Status: None   Collection Time: 10/11/17  6:12 PM  Result Value Ref Range   Lactic Acid, Venous 1.10 0.5 - 1.9 mmol/L  CBG monitoring, ED     Status: None   Collection Time: 10/11/17  8:04 PM  Result Value Ref Range   Glucose-Capillary 75 65 - 99 mg/dL  Aerobic/Anaerobic Culture (surgical/deep wound)     Status: None (Preliminary result)   Collection Time: 10/11/17  9:11 PM  Result Value Ref Range   Specimen Description WOUND LEFT FINGER    Special Requests LEFT INDEX FINGER AMPUTATION    Gram Stain NO WBC SEEN NO ORGANISMS SEEN     Culture      CULTURE REINCUBATED FOR BETTER GROWTH Performed at [facility], [place] [address] [place], [place] [address]    Report Status PENDING   Glucose, capillary     Status: None   Collection Time: 10/11/17   9:26 PM  Result Value Ref Range   Glucose-Capillary 91 65 - 99 mg/dL  Protime-INR     Status: None   Collection Time: 10/11/17 10:38 PM  Result Value Ref Range   Prothrombin Time 12.2 11.4 - 15.2 seconds   INR 0.92     Comment: Performed at [facility], [place] [address], [place], [place] [address]  T-helper cells (CD4) count (not at [facility])     Status: None   Collection Time: 10/11/17 10:38 PM  Result Value Ref Range   CD4 T Cell Abs 890 400 - 2,700 /uL   CD4 % Helper T Cell 48 33 - 55 %    Comment: Performed at [facility], [place] [address], [place], [place] [address]  Glucose, capillary     Status: Abnormal   Collection Time: 10/11/17 11:16 PM  Result Value Ref Range   Glucose-Capillary 128 (H) 65 - 99 mg/dL  Basic metabolic panel     Status: Abnormal   Collection Time: 10/12/17  8:07 AM  Result Value Ref Range   Sodium 134 (L) 135 - 145 mmol/L   Potassium 4.6 3.5 - 5.1 mmol/L   Chloride 106 101 - 111 mmol/L   CO2 22 22 - 32 mmol/L   Glucose, Bld 267 (H) 65 - 99 mg/dL   BUN 31 (H) 6 - 20 mg/dL   Creatinine, Ser 1.72 (H) 0.61 - 1.24 mg/dL   Calcium 8.4 (L) 8.9 - 10.3 mg/dL   GFR calc non Af Amer 43 (L) >60 mL/min   GFR calc Af Amer 50 (L) >60 mL/min    Comment: (NOTE) The eGFR has been calculated using the CKD EPI equation. This calculation has not been validated in all clinical situations. eGFR's persistently <60 mL/min signify possible Chronic Kidney Disease.    Anion gap 6 5 - 15    Comment: Performed at [facility] [place] [address] [place] [address]  CBC     Status: Abnormal   Collection Time: 10/12/17  8:07 AM  Result Value Ref Range   WBC 9.0 4.0 - 10.5 K/uL   RBC 3.93 (L) 4.22 - 5.81 MIL/uL   Hemoglobin 10.9 (L) 13.0 - 17.0 g/dL   HCT 34.1 (L) 39.0 - 52.0 %   MCV  86.8 78.0 - 100.0 fL   MCH 27.7 26.0 - 34.0 pg   MCHC 32.0 30.0 - 36.0 g/dL   RDW 15.9 (H) 11.5 - 15.5 %   Platelets 367 150 - 400 K/uL    Comment: Performed at  [facility] [address] [place], [place] [address]  Glucose, capillary     Status: Abnormal   Collection Time: 10/12/17  8:30 AM  Result Value Ref Range   Glucose-Capillary 228 (H) 65 - 99 mg/dL  Glucose, capillary     Status: Abnormal   Collection Time: 10/12/17 11:55 AM  Result Value Ref Range   Glucose-Capillary 175 (H) 65 - 99 mg/dL      Component Value Date/Time   SDES WOUND LEFT FINGER 10/11/2017 2111   SPECREQUEST LEFT INDEX FINGER AMPUTATION 10/11/2017 2111   CULT  10/11/2017 2111    CULTURE REINCUBATED FOR BETTER GROWTH Performed at [facility], [place] [address], [place], [place] [address]    REPTSTATUS PENDING 10/11/2017 2111   Dg Chest 1 View  Result Date: 10/11/2017 CLINICAL DATA:  Preop EXAM: CHEST  1 VIEW COMPARISON:  08/17/2017 FINDINGS: The heart size and mediastinal contours are within normal limits. Both lungs are clear. The visualized skeletal structures are unremarkable. IMPRESSION: No active disease. Electronically Signed   By: Donavan Foil M.D.   On: 10/11/2017 20:16   Dg Finger Index Left  Result Date: 10/11/2017 CLINICAL DATA:  The patient suffered a laceration of the left index finger on a razor blade 1 week ago. Pain, redness and swelling. Initial encounter. EXAM: LEFT INDEX FINGER 2+V COMPARISON:  None. FINDINGS: Soft tissues of the index finger are swollen. There is bony destructive change throughout the distal phalanx consistent with osteomyelitis. No radiopaque foreign body is identified. IMPRESSION: Bony destructive change throughout the distal phalanx of the index finger consistent with osteomyelitis. Associated soft tissue swelling is noted. Electronically Signed   By: Inge Rise M.D.   On: 10/11/2017 12:51   Recent Results (from the past 240 hour(s))  WOUND CULTURE     Status: Abnormal (Preliminary result)   Collection Time: 10/11/17 10:52 AM  Result Value Ref Range Status   MICRO NUMBER: [id number]  Preliminary   SPECIMEN  QUALITY: ADEQUATE  Preliminary   SOURCE: FINGER  Preliminary   STATUS: PRELIMINARY  Preliminary   GRAM STAIN:   Preliminary    No white blood cells seen No epithelial cells seen No organisms seen   ISOLATE 1: Streptococcus pyogenes (A)  Preliminary    Comment: Moderate growth of Group A Streptococcus isolated Beta-hemolytic Streptococci are predictably susceptible to penicillin and other beta-lactams. Susceptibility testing not routinely performed.  Blood culture (routine x 2)     Status: None (Preliminary result)   Collection Time: 10/11/17  4:41 PM  Result Value Ref Range Status   Specimen Description BLOOD RIGHT ANTECUBITAL  Final   Special Requests   Final    IN BOTH AEROBIC AND ANAEROBIC BOTTLES Blood Culture adequate volume   Culture   Final    NO GROWTH < 24 HOURS Performed at [facility], [place] [address] [place], [place] [address]    Report Status PENDING  Incomplete  Blood culture (routine x 2)     Status: None (Preliminary result)   Collection Time: 10/11/17  5:04 PM  Result Value Ref Range Status   Specimen Description BLOOD LEFT ANTECUBITAL  Final   Special Requests   Final    BOTTLES  DRAWN AEROBIC AND ANAEROBIC Blood Culture adequate volume   Culture   Final    NO GROWTH < 24 HOURS Performed at [facility], [place] [address] [place], [place] [address]    Report Status PENDING  Incomplete  Aerobic/Anaerobic Culture (surgical/deep wound)     Status: None (Preliminary result)   Collection Time: 10/11/17  9:11 PM  Result Value Ref Range Status   Specimen Description WOUND LEFT FINGER  Final   Special Requests LEFT INDEX FINGER AMPUTATION  Final   Gram Stain NO WBC SEEN NO ORGANISMS SEEN   Final   Culture   Final    CULTURE REINCUBATED FOR BETTER GROWTH Performed at [facility], [place] [address] [place], [place] [address]    Report Status PENDING  Incomplete    Microbiology: Recent Results (from the past 240 hour(s))  WOUND CULTURE     Status:  Abnormal (Preliminary result)   Collection Time: 10/11/17 10:52 AM  Result Value Ref Range Status   MICRO NUMBER: [id number]  Preliminary   SPECIMEN QUALITY: ADEQUATE  Preliminary   SOURCE: FINGER  Preliminary   STATUS: PRELIMINARY  Preliminary   GRAM STAIN:   Preliminary    No white blood cells seen No epithelial cells seen No organisms seen   ISOLATE 1: Streptococcus pyogenes (A)  Preliminary    Comment: Moderate growth of Group A Streptococcus isolated Beta-hemolytic Streptococci are predictably susceptible to penicillin and other beta-lactams. Susceptibility testing not routinely performed.  Blood culture (routine x 2)     Status: None (Preliminary result)   Collection Time: 10/11/17  4:41 PM  Result Value Ref Range Status   Specimen Description BLOOD RIGHT ANTECUBITAL  Final   Special Requests   Final    IN BOTH AEROBIC AND ANAEROBIC BOTTLES Blood Culture adequate volume   Culture   Final    NO GROWTH < 24 HOURS Performed at [facility], [place] [address], [place], [place] [address]    Report Status PENDING  Incomplete  Blood culture (routine x 2)     Status: None (Preliminary result)   Collection Time: 10/11/17  5:04 PM  Result Value Ref Range Status   Specimen Description BLOOD LEFT ANTECUBITAL  Final   Special Requests   Final    BOTTLES DRAWN AEROBIC AND ANAEROBIC Blood Culture adequate volume   Culture   Final    NO GROWTH < 24 HOURS Performed at [facility], [place] [address] [place], [place] [address]    Report Status PENDING  Incomplete  Aerobic/Anaerobic Culture (surgical/deep wound)     Status: None (Preliminary result)   Collection Time: 10/11/17  9:11 PM  Result Value Ref Range Status   Specimen Description WOUND LEFT FINGER  Final   Special Requests LEFT INDEX FINGER AMPUTATION  Final   Gram Stain NO WBC SEEN NO ORGANISMS SEEN   Final   Culture   Final    CULTURE REINCUBATED FOR BETTER GROWTH Performed at [facility], [place] [address]  [place], [place] [address]    Report Status PENDING  Incomplete    Radiographs and labs were personally reviewed by me.   Bobby Rumpf, MD [facility] for Infectious [place] [facility] [phone] 10/12/2017, 3:26 PM

## 2017-10-13 DIAGNOSIS — L02519 Cutaneous abscess of unspecified hand: Secondary | ICD-10-CM

## 2017-10-13 DIAGNOSIS — M86142 Other acute osteomyelitis, left hand: Secondary | ICD-10-CM

## 2017-10-13 DIAGNOSIS — E118 Type 2 diabetes mellitus with unspecified complications: Secondary | ICD-10-CM

## 2017-10-13 DIAGNOSIS — B9561 Methicillin susceptible Staphylococcus aureus infection as the cause of diseases classified elsewhere: Secondary | ICD-10-CM

## 2017-10-13 DIAGNOSIS — L089 Local infection of the skin and subcutaneous tissue, unspecified: Secondary | ICD-10-CM

## 2017-10-13 DIAGNOSIS — B950 Streptococcus, group A, as the cause of diseases classified elsewhere: Secondary | ICD-10-CM

## 2017-10-13 LAB — WOUND CULTURE
MICRO NUMBER: 90460295
SPECIMEN QUALITY:: ADEQUATE

## 2017-10-13 LAB — CBC
HCT: 34.6 % — ABNORMAL LOW (ref 39.0–52.0)
HEMOGLOBIN: 11 g/dL — AB (ref 13.0–17.0)
MCH: 27.8 pg (ref 26.0–34.0)
MCHC: 31.8 g/dL (ref 30.0–36.0)
MCV: 87.6 fL (ref 78.0–100.0)
Platelets: 349 10*3/uL (ref 150–400)
RBC: 3.95 MIL/uL — ABNORMAL LOW (ref 4.22–5.81)
RDW: 16.3 % — ABNORMAL HIGH (ref 11.5–15.5)
WBC: 12.2 10*3/uL — ABNORMAL HIGH (ref 4.0–10.5)

## 2017-10-13 LAB — GLUCOSE, CAPILLARY
GLUCOSE-CAPILLARY: 140 mg/dL — AB (ref 65–99)
GLUCOSE-CAPILLARY: 106 mg/dL — AB (ref 65–99)
Glucose-Capillary: 95 mg/dL (ref 65–99)
Glucose-Capillary: 174 mg/dL — ABNORMAL HIGH (ref 65–99)

## 2017-10-13 LAB — CD4/CD8 (T-HELPER/T-SUPPRESSOR CELL)
CD4 absolute: 1120 /uL (ref 500–1900)
CD4%: 44 % (ref 30.0–60.0)
CD8 T Cell Abs: 780 /uL (ref 230–1000)
CD8tox: 31 % (ref 15.0–40.0)
RATIO: 1.44 (ref 1.0–3.0)
TOTAL LYMPHOCYTE COUNT: 2550 /uL (ref 1000–4000)

## 2017-10-13 LAB — BASIC METABOLIC PANEL
Anion gap: 8 (ref 5–15)
BUN: 19 mg/dL (ref 6–20)
CO2: 24 mmol/L (ref 22–32)
CREATININE: 1.44 mg/dL — AB (ref 0.61–1.24)
Calcium: 8.3 mg/dL — ABNORMAL LOW (ref 8.9–10.3)
Chloride: 107 mmol/L (ref 101–111)
GFR calc non Af Amer: 54 mL/min — ABNORMAL LOW (ref >60)
Glucose, Bld: 162 mg/dL — ABNORMAL HIGH (ref 65–99)
Potassium: 3.9 mmol/L (ref 3.5–5.1)
Sodium: 139 mmol/L (ref 135–145)

## 2017-10-13 LAB — HIV-1 RNA QUANT-NO REFLEX-BLD
HIV 1 RNA QUANT: NOT DETECTED {copies}/mL
HIV 1 RNA QUANT: 40 {copies}/mL
HIV-1 RNA Quant, Log: <1.3 Log copies/mL
LOG10 HIV-1 RNA: 1.602 {Log_copies}/mL

## 2017-10-13 LAB — HIV-1 RNA, QUALITATIVE, TMA: HIV-1 RNA, QUAL: NEGATIVE

## 2017-10-13 MED ORDER — HYDROCODONE-ACETAMINOPHEN 5-325 MG PO TABS
1.0000 | ORAL_TABLET | ORAL | Status: DC | PRN
  Administered 2017-10-13 (×3): 1 via ORAL
  Filled 2017-10-13 (×3): qty 1

## 2017-10-13 NOTE — Progress Notes (Signed)
Inpatient Diabetes Program Recommendations  AACE/ADA: New Consensus Statement on Inpatient Glycemic Control (2019)  Target Ranges:  Prepandial:   less than 140 mg/dL      Peak postprandial:   less than 180 mg/dL (1-2 hours)      Critically ill patients:  140 - 180 mg/dL   Results for Donald Ross, Donald Ross (MRN [id number]) as of 10/13/2017 10:43  Ref. Range 10/12/2017 08:30 10/12/2017 11:55 10/12/2017 16:52 10/12/2017 21:54 10/13/2017 07:52  Glucose-Capillary Latest Ref Range: 65 - 99 mg/dL 228 (H) 175 (H) 227 (H) 319 (H) 95   Review of Glycemic Control  Diabetes history: DM2 Outpatient Diabetes medications:Lantus 48 units daily (per H&P has not taken Lantus in about 1 month), Metformin 1000 mg BID, Januvia 50 mg daily Current orders for Inpatient glycemic control: Lantus 10 units QHS, Novolog 0-15 units TID with meals, Novolog 0-5 units QHS  Inpatient Diabetes Program Recommendations: Insulin-Meal Coverage: Please consider ordering Novolog 3 units TID with meals for meal coverage if patient eats at least 50% of meals. HgbA1C: Please consider ordering an A1C to evaluate glycemic control over the past 2-3 months.  Thanks, Barnie Alderman, RN, MSN, CDE Diabetes Coordinator Inpatient Diabetes Program [phone] (Team Pager from 8am to 5pm)

## 2017-10-13 NOTE — Progress Notes (Addendum)
INFECTIOUS DISEASE PROGRESS NOTE  ID: Donald Ross is a 57 y.o. male with  Principal Problem:   Finger infection Active Problems:   Asymptomatic HIV infection (Donald Ross)   Diabetes mellitus type 2 with complications (Donald Ross)   CVA (cerebral vascular accident) (Donald Ross)   Tobacco abuse   Hyperlipidemia associated with type 2 diabetes mellitus (Donald Ross)   Hypertension   Osteomyelitis (Donald Ross)   CKD (chronic kidney disease)   Drug abuse (Donald Ross)  Subjective: No complaints.   Abtx:  Anti-infectives (From admission, onward)   Start     Dose/Rate Route Frequency Ordered Stop   10/12/17 1600  cefTRIAXone (ROCEPHIN) 2 g in sodium chloride 0.9 % 100 mL IVPB     2 g 200 mL/hr over 30 Minutes Intravenous Every 24 hours 10/12/17 1536     10/12/17 0630  vancomycin (VANCOCIN) IVPB 750 mg/150 ml premix  Status:  Discontinued     750 mg 150 mL/hr over 60 Minutes Intravenous Every 12 hours 10/11/17 1753 10/12/17 1536   10/12/17 0000  piperacillin-tazobactam (ZOSYN) IVPB 3.375 g  Status:  Discontinued     3.375 g 12.5 mL/hr over 240 Minutes Intravenous Every 8 hours 10/11/17 1753 10/12/17 1536   10/11/17 1800  abacavir-dolutegravir-lamiVUDine (TRIUMEQ) 600-50-300 MG per tablet 1 tablet     1 tablet Oral Daily 10/11/17 1745     10/11/17 1800  vancomycin (VANCOCIN) 1,500 mg in sodium chloride 0.9 % 500 mL IVPB     1,500 mg 250 mL/hr over 120 Minutes Intravenous  Once 10/11/17 1752 10/11/17 2033   10/11/17 1645  piperacillin-tazobactam (ZOSYN) IVPB 3.375 g     3.375 g 100 mL/hr over 30 Minutes Intravenous  Once 10/11/17 1638 10/11/17 1844   10/11/17 1645  vancomycin (VANCOCIN) IVPB 1000 mg/200 mL premix  Status:  Discontinued     1,000 mg 200 mL/hr over 60 Minutes Intravenous  Once 10/11/17 1638 10/11/17 1752      Medications:  Scheduled: . abacavir-dolutegravir-lamiVUDine  1 tablet Oral Daily  . atorvastatin  10 mg Oral Daily  . enoxaparin (LOVENOX) injection  40 mg Subcutaneous Q24H  . fenofibrate  54  mg Oral Daily  . insulin aspart  0-15 Units Subcutaneous TID WC  . insulin aspart  0-5 Units Subcutaneous QHS  . insulin glargine  10 Units Subcutaneous QHS  . lidocaine  10 mL Infiltration Once  . pantoprazole  40 mg Oral Daily    Objective: Vital signs in last 24 hours: Temp:  [98.2 F (36.8 C)-98.3 F (36.8 C)] 98.3 F (36.8 C) (04/17 0507) Pulse Rate:  [80-97] 80 (04/17 0507) Resp:  [16] 16 (04/17 0507) BP: (119-125)/(63-74) 125/74 (04/17 0507) SpO2:  [95 %-100 %] 100 % (04/17 0507)   General appearance: alert, cooperative and no distress Resp: clear to auscultation bilaterally Cardio: regular rate and rhythm GI: normal findings: bowel sounds normal and soft, non-tender Extremities: L hand wrapped. no proximal tenderness or swelling.   Lab Results Recent Labs    10/12/17 0807 10/13/17 0438  WBC 9.0 12.2*  HGB 10.9* 11.0*  HCT 34.1* 34.6*  NA 134* 139  K 4.6 3.9  CL 106 107  CO2 22 24  BUN 31* 19  CREATININE 1.72* 1.44*   Liver Panel Recent Labs    10/11/17 1049 10/11/17 1208  PROT 7.2 7.6  ALBUMIN  --  3.4*  AST 20 22  ALT 17 21  ALKPHOS  --  69  BILITOT 0.2 0.3   Sedimentation Rate Recent Labs  10/11/17 1049  ESRSEDRATE 101*   C-Reactive Protein Recent Labs    10/11/17 1049  CRP 24.4*    Microbiology: Recent Results (from the past 240 hour(s))  WOUND CULTURE     Status: Abnormal   Collection Time: 10/11/17 10:52 AM  Result Value Ref Range Status   MICRO NUMBER: [id number]  Final   SPECIMEN QUALITY: ADEQUATE  Final   SOURCE: FINGER  Final   STATUS: FINAL  Final   GRAM STAIN:   Final    No white blood cells seen No epithelial cells seen No organisms seen   ISOLATE 1: Streptococcus pyogenes (A)  Final    Comment: Moderate growth of Group A Streptococcus isolated Beta-hemolytic Streptococci are predictably susceptible to penicillin and other beta-lactams. Susceptibility testing not routinely performed.  Blood culture (routine x 2)      Status: None (Preliminary result)   Collection Time: 10/11/17  4:41 PM  Result Value Ref Range Status   Specimen Description BLOOD RIGHT ANTECUBITAL  Final   Special Requests   Final    IN BOTH AEROBIC AND ANAEROBIC BOTTLES Blood Culture adequate volume   Culture   Final    NO GROWTH < 24 HOURS Performed at [facility], [place] [address] [place], [place] [address]    Report Status PENDING  Incomplete  Blood culture (routine x 2)     Status: None (Preliminary result)   Collection Time: 10/11/17  5:04 PM  Result Value Ref Range Status   Specimen Description BLOOD LEFT ANTECUBITAL  Final   Special Requests   Final    BOTTLES DRAWN AEROBIC AND ANAEROBIC Blood Culture adequate volume   Culture   Final    NO GROWTH < 24 HOURS Performed at [facility], [place] [address] [place], [place] [address]    Report Status PENDING  Incomplete  Aerobic/Anaerobic Culture (surgical/deep wound)     Status: None (Preliminary result)   Collection Time: 10/11/17  9:11 PM  Result Value Ref Range Status   Specimen Description WOUND LEFT FINGER  Final   Special Requests LEFT INDEX FINGER AMPUTATION  Final   Gram Stain   Final    NO WBC SEEN NO ORGANISMS SEEN Performed at [facility], [address] [place], [place] [address]    Culture MODERATE STAPHYLOCOCCUS AUREUS  Final   Report Status PENDING  Incomplete    Studies/Results: Dg Chest 1 View  Result Date: 10/11/2017 CLINICAL DATA:  Preop EXAM: CHEST  1 VIEW COMPARISON:  08/17/2017 FINDINGS: The heart size and mediastinal contours are within normal limits. Both lungs are clear. The visualized skeletal structures are unremarkable. IMPRESSION: No active disease. Electronically Signed   By: Donald Ross M.D.   On: 10/11/2017 20:16   Dg Finger Index Left  Result Date: 10/11/2017 CLINICAL DATA:  The patient suffered a laceration of the left index finger on a razor blade 1 week ago. Pain, redness and swelling. Initial encounter. EXAM:  LEFT INDEX FINGER 2+V COMPARISON:  None. FINDINGS: Soft tissues of the index finger are swollen. There is bony destructive change throughout the distal phalanx consistent with osteomyelitis. No radiopaque foreign body is identified. IMPRESSION: Bony destructive change throughout the distal phalanx of the index finger consistent with osteomyelitis. Associated soft tissue swelling is noted. Electronically Signed   By: Inge Rise M.D.   On: 10/11/2017 12:51     Assessment/Plan: HIV+ Finger abscess (staph aureus, GAS)  Total days of antibiotics: 2 vanco/zosyn  Will  change him to ancef Will await Cx He would be a good candidate for oritavancin prior to d/c (will give him serum levels for 10-14 days with single dose) Wound care per surgery (at his outpt f/u). appreciate their care of him.  My great appreciation to Dr Vonzell Schlatter MD, FACP Infectious Diseases (pager) [phone] [email or web address] 10/13/2017, 12:01 PM  LOS: 2 days

## 2017-10-13 NOTE — Progress Notes (Signed)
[facility] Infusion Coordinator will follow pt with ID team to support outpatient/home IVABX if needed at DC.  If patient discharges after hours, please call [phone].   Larry Sierras 10/13/2017, 11:43 AM

## 2017-10-13 NOTE — Progress Notes (Signed)
PROGRESS NOTE    Donald Ross   WIO:[id number]  DOB: March 02, 1961  DOA: 10/11/2017 PCP: Bernerd Limbo, MD   Brief Narrative:  Donald Ross 57 year old gentleman who presented with left hand index finger pain and edema. He does have the significant past medical history of HIV on anti-retroviral therapy, undetectable viral load, type 2 diabetes mellitus, hypertension, dyslipidemia and history of CVA April 2017. About 7 days ago he cut his left hand index finger with a rusty metal, but not until 3 days ago he started experiencing local pain and edema.  x-ray >> bony destructive change throughout the distal phalanx of the index finger consistent with osteomyelitis.   Subjective: Having occasional pain in the finger but it feels numb as well. Wants to go home.     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 - culture growing Staph aureus- sensitivities pending- appreciate ID eval - per Dr Grandville Silos, dressing is not to be opened- he needs to f/u in 2 wks - add Hydrocodone for pain control- he is only on Morphine  Active Problems:   Asymptomatic HIV infection   - cont ART    Diabetes mellitus type 2 with complications  - Lantus and SSI  - A1c was 7.1 in 10/18   CKD 3 - Cr 2.04 on admission improved to which is 1.40    CVA (cerebral vascular accident) in the past - MRI on 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 showed a Lacunar infarct - on statins but not ASA- will start ASA  HTN - BP meds on hold as BP normal   DVT prophylaxis: Lovenox Code Status: Full code Family Communication:  Disposition Plan: home once sensitivities return Consultants:   ID  Hand surgery Procedures:   Amputation mentioned above Antimicrobials:  Anti-infectives (From admission, onward)   Start     Dose/Rate Route Frequency Ordered Stop   10/12/17 1600  cefTRIAXone (ROCEPHIN) 2 g in sodium chloride 0.9 % 100 mL IVPB   Status:  Discontinued     2 g 200 mL/hr over 30 Minutes Intravenous Every 24 hours 10/12/17 1536 10/13/17 1204   10/12/17 0630  vancomycin (VANCOCIN) IVPB 750 mg/150 ml premix  Status:  Discontinued     750 mg 150 mL/hr over 60 Minutes Intravenous Every 12 hours 10/11/17 1753 10/12/17 1536   10/12/17 0000  piperacillin-tazobactam (ZOSYN) IVPB 3.375 g  Status:  Discontinued     3.375 g 12.5 mL/hr over 240 Minutes Intravenous Every 8 hours 10/11/17 1753 10/12/17 1536   10/11/17 1800  abacavir-dolutegravir-lamiVUDine (TRIUMEQ) 600-50-300 MG per tablet 1 tablet     1 tablet Oral Daily 10/11/17 1745     10/11/17 1800  vancomycin (VANCOCIN) 1,500 mg in sodium chloride 0.9 % 500 mL IVPB     1,500 mg 250 mL/hr over 120 Minutes Intravenous  Once 10/11/17 1752 10/11/17 2033   10/11/17 1645  piperacillin-tazobactam (ZOSYN) IVPB 3.375 g     3.375 g 100 mL/hr over 30 Minutes Intravenous  Once 10/11/17 1638 10/11/17 1844   10/11/17 1645  vancomycin (VANCOCIN) IVPB 1000 mg/200 mL premix  Status:  Discontinued     1,000 mg 200 mL/hr over 60 Minutes Intravenous  Once 10/11/17 1638 10/11/17 1752       Objective: Vitals:   10/12/17 0550 10/12/17 1357 10/12/17 2057 10/13/17 0507  BP: 107/77 119/63 121/66 125/74  Pulse: 96 95 97 80  Resp: 16 16 16 16   Temp:  98.1 F (36.7 C) 98.2 F (36.8 C) 98.3 F (36.8 C) 98.3 F (36.8 C)  TempSrc: Oral Oral Oral Oral  SpO2: 100% 95% 96% 100%  Weight:      Height:        Intake/Output Summary (Last 24 hours) at 10/13/2017 1336 Last data filed at 10/13/2017 1229 Gross per 24 hour  Intake 900 ml  Output 2700 ml  Net -1800 ml   Filed Weights   10/11/17 2325  Weight: 75.8 kg (167 lb)    Examination: General exam: Appears comfortable  HEENT: PERRLA, oral mucosa moist, no sclera icterus or thrush Respiratory system: Clear to auscultation. Respiratory effort normal. Cardiovascular system: S1 & S2 heard, RRR.   Gastrointestinal system: Abdomen soft,  non-tender, nondistended. Normal bowel sound. No organomegaly Central nervous system: Alert and oriented. No focal neurological deficits. Extremities: No cyanosis, clubbing or edema Skin: No rashes or ulcers- dressing not opened Psychiatry:  Mood & affect appropriate.     Data Reviewed: I have personally reviewed following labs and imaging studies  CBC: Recent Labs  Lab 10/11/17 1049 10/11/17 1208 10/11/17 1651 10/12/17 0807 10/13/17 0438  WBC 9.7 11.2* 11.2* 9.0 12.2*  NEUTROABS  --  7.6 7.6  --   --   HGB 12.2* 12.3* 12.6* 10.9* 11.0*  HCT 37.5* 38.4* 39.0 34.1* 34.6*  MCV 83.7 87.3 86.5 86.8 87.6  PLT 407* 398 379 367 767   Basic Metabolic Panel: Recent Labs  Lab 10/11/17 1049 10/11/17 1208 10/12/17 0807 10/13/17 0438  NA 138 138 134* 139  K 4.4 4.2 4.6 3.9  CL 106 106 106 107  CO2 24 22 22 24   GLUCOSE 106* 101* 267* 162*  BUN 36* 34* 31* 19  CREATININE 2.04* 1.93* 1.72* 1.44*  CALCIUM 9.6 9.4 8.4* 8.3*   GFR: Estimated Creatinine Clearance: 56.7 mL/min (A) (by C-G formula based on SCr of 1.44 mg/dL (H)). Liver Function Tests: Recent Labs  Lab 10/11/17 1049 10/11/17 1208  AST 20 22  ALT 17 21  ALKPHOS  --  69  BILITOT 0.2 0.3  PROT 7.2 7.6  ALBUMIN  --  3.4*   No results for input(s): LIPASE, AMYLASE in the last 168 hours. No results for input(s): AMMONIA in the last 168 hours. Coagulation Profile: Recent Labs  Lab 10/11/17 2238  INR 0.92   Cardiac Enzymes: No results for input(s): CKTOTAL, CKMB, CKMBINDEX, TROPONINI in the last 168 hours. BNP (last 3 results) No results for input(s): PROBNP in the last 8760 hours. HbA1C: No results for input(s): HGBA1C in the last 72 hours. CBG: Recent Labs  Lab 10/12/17 1155 10/12/17 1652 10/12/17 2154 10/13/17 0752 10/13/17 1229  GLUCAP 175* 227* 319* 95 140*   Lipid Profile: No results for input(s): CHOL, HDL, LDLCALC, TRIG, CHOLHDL, LDLDIRECT in the last 72 hours. Thyroid Function Tests: No  results for input(s): TSH, T4TOTAL, FREET4, T3FREE, THYROIDAB in the last 72 hours. Anemia Panel: No results for input(s): VITAMINB12, FOLATE, FERRITIN, TIBC, IRON, RETICCTPCT in the last 72 hours. Urine analysis: No results found for: COLORURINE, APPEARANCEUR, LABSPEC, [place], GLUCOSEU, HGBUR, BILIRUBINUR, KETONESUR, PROTEINUR, UROBILINOGEN, NITRITE, LEUKOCYTESUR Sepsis Labs: @LABRCNTIP (procalcitonin:4,lacticidven:4) ) Recent Results (from the past 240 hour(s))  WOUND CULTURE     Status: Abnormal   Collection Time: 10/11/17 10:52 AM  Result Value Ref Range Status   MICRO NUMBER: [id number]  Final   SPECIMEN QUALITY: ADEQUATE  Final   SOURCE: FINGER  Final   STATUS: FINAL  Final   GRAM STAIN:  Final    No white blood cells seen No epithelial cells seen No organisms seen   ISOLATE 1: Streptococcus pyogenes (A)  Final    Comment: Moderate growth of Group A Streptococcus isolated Beta-hemolytic Streptococci are predictably susceptible to penicillin and other beta-lactams. Susceptibility testing not routinely performed.  Blood culture (routine x 2)     Status: None (Preliminary result)   Collection Time: 10/11/17  4:41 PM  Result Value Ref Range Status   Specimen Description BLOOD RIGHT ANTECUBITAL  Final   Special Requests   Final    IN BOTH AEROBIC AND ANAEROBIC BOTTLES Blood Culture adequate volume   Culture   Final    NO GROWTH < 24 HOURS Performed at [facility], [place] [address], [place], [place] [address]    Report Status PENDING  Incomplete  Blood culture (routine x 2)     Status: None (Preliminary result)   Collection Time: 10/11/17  5:04 PM  Result Value Ref Range Status   Specimen Description BLOOD LEFT ANTECUBITAL  Final   Special Requests   Final    BOTTLES DRAWN AEROBIC AND ANAEROBIC Blood Culture adequate volume   Culture   Final    NO GROWTH < 24 HOURS Performed at [facility], [place] [address], [place], [place] [address]    Report Status PENDING   Incomplete  Aerobic/Anaerobic Culture (surgical/deep wound)     Status: None (Preliminary result)   Collection Time: 10/11/17  9:11 PM  Result Value Ref Range Status   Specimen Description WOUND LEFT FINGER  Final   Special Requests LEFT INDEX FINGER AMPUTATION  Final   Gram Stain   Final    NO WBC SEEN NO ORGANISMS SEEN Performed at [facility], [address] [place], [place] [address]    Culture MODERATE STAPHYLOCOCCUS AUREUS  Final   Report Status PENDING  Incomplete         Radiology Studies: Dg Chest 1 View  Result Date: 10/11/2017 CLINICAL DATA:  Preop EXAM: CHEST  1 VIEW COMPARISON:  08/17/2017 FINDINGS: The heart size and mediastinal contours are within normal limits. Both lungs are clear. The visualized skeletal structures are unremarkable. IMPRESSION: No active disease. Electronically Signed   By: Donavan Foil M.D.   On: 10/11/2017 20:16      Scheduled Meds: . abacavir-dolutegravir-lamiVUDine  1 tablet Oral Daily  . atorvastatin  10 mg Oral Daily  . enoxaparin (LOVENOX) injection  40 mg Subcutaneous Q24H  . fenofibrate  54 mg Oral Daily  . insulin aspart  0-15 Units Subcutaneous TID WC  . insulin aspart  0-5 Units Subcutaneous QHS  . insulin glargine  10 Units Subcutaneous QHS  . lidocaine  10 mL Infiltration Once  . pantoprazole  40 mg Oral Daily   Continuous Infusions:   LOS: 2 days    Time spent in minutes: 35    Debbe Odea, MD [facility] Pager: [email or web address] Password [email or web address] 10/13/2017, 1:36 PM

## 2017-10-14 DIAGNOSIS — R809 Proteinuria, unspecified: Secondary | ICD-10-CM

## 2017-10-14 DIAGNOSIS — N183 Chronic kidney disease, stage 3 (moderate): Secondary | ICD-10-CM

## 2017-10-14 DIAGNOSIS — I6359 Cerebral infarction due to unspecified occlusion or stenosis of other cerebral artery: Secondary | ICD-10-CM

## 2017-10-14 DIAGNOSIS — L02512 Cutaneous abscess of left hand: Secondary | ICD-10-CM

## 2017-10-14 DIAGNOSIS — E1129 Type 2 diabetes mellitus with other diabetic kidney complication: Secondary | ICD-10-CM

## 2017-10-14 LAB — GLUCOSE, CAPILLARY
GLUCOSE-CAPILLARY: 188 mg/dL — AB (ref 65–99)
Glucose-Capillary: 95 mg/dL (ref 65–99)

## 2017-10-14 MED ORDER — CEPHALEXIN 500 MG PO CAPS: 500.0000 mg | ORAL_CAPSULE | Freq: Four times a day (QID) | ORAL | 0 refills | Status: DC

## 2017-10-14 MED ORDER — HYDROCODONE-ACETAMINOPHEN 5-325 MG PO TABS: 1.0000 | ORAL_TABLET | Freq: Four times a day (QID) | ORAL | 0 refills | Status: DC | PRN

## 2017-10-14 MED ORDER — ASPIRIN EC 81 MG PO TBEC: 81.0000 mg | DELAYED_RELEASE_TABLET | Freq: Every day | ORAL | 0 refills | Status: AC

## 2017-10-14 MED ORDER — SENNOSIDES-DOCUSATE SODIUM 8.6-50 MG PO TABS: 1.0000 | ORAL_TABLET | Freq: Every evening | ORAL | 0 refills | Status: DC | PRN

## 2017-10-14 MED ORDER — CEFAZOLIN SODIUM-DEXTROSE 1-4 GM/50ML-% IV SOLN
1.0000 g | Freq: Three times a day (TID) | INTRAVENOUS | Status: DC
  Administered 2017-10-14: 1 g via INTRAVENOUS
  Filled 2017-10-14 (×2): qty 50

## 2017-10-14 NOTE — Discharge Summary (Addendum)
Physician Discharge Summary  Donald Ross [id number] DOB: 09/11/1960 DOA: 10/11/2017  PCP: Donald Limbo, MD  Admit date: 10/11/2017 Discharge date: 10/14/2017  Admitted From: home Disposition:  home   Recommendations for Outpatient Follow-up:  1. I have given him a script for 15 tabs of Hydocodone/APAP 5/325- f/u with PCP for more 2. Bmet on Monday as he is on Bactrim 3. 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 He has HIV and is on anti-retroviral therapy with undetectable viral load, type 2 diabetes mellitus, hypertension, dyslipidemia and history of CVA April 2017.  About 7 days ago he cut his left hand index finger with a rusty metal and about 3 days ago he started experiencing local pain and edema. He presented to the ID clinice on 4/15 and was sent to the ED. x-ray in ED >> bony destructive change throughout the distal phalanx of the index finger consistent with osteomyelitis.   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 Per Donald Ross drug database,last script was 1 mo ago and was for 30 days. I will give him 15 tabs to allow him to get to his PCP to get further refills.   Active Problems:   Asymptomatic HIV infection   - cont ART- appreciate ID assistance    Diabetes mellitus type 2 with complications - K9T was 7.1 in 10/18 - resumed home meds noted below   CKD 3 - Cr 2.04 on admission improved to which is 1.40  HTN - Losartan/ HCTZ at home-   CVA (cerebral vascular accident) in the past - MRI on 10/18 showed a Lacunar infarct - on statins but not ASA- will start ASA   Discharge Exam: Vitals:   10/13/17 2114 10/14/17 0506  BP: (!) 144/95 129/83  Pulse: 80 72  Resp: 17 20  Temp: 97.7 F (36.5 C) 97.6 F (36.4 C)  SpO2: 100% 100%   Vitals:   10/13/17 0507 10/13/17 1512 10/13/17 2114 10/14/17 0506  BP: 125/74 (!) 142/94 (!) 144/95 129/83  Pulse: 80 84 80 72  Resp: 16  17 20   Temp: 98.3 F (36.8 C) 97.6 F (36.4 C) 97.7 F (36.5 C) 97.6 F (36.4 C)  TempSrc: Oral Oral Oral Oral  SpO2: 100% 100% 100% 100%  Weight:      Height:        General: Pt is alert, awake, not in acute distress Cardiovascular: RRR, S1/S2 +, no rubs, no gallops Respiratory: CTA  bilaterally, no wheezing, no rhonchi Abdominal: Soft, NT, ND, bowel sounds + Extremities: no edema, no cyanosis- left index finger sutures clean. No discharge.   Discharge Instructions  Discharge Instructions    Diet - low sodium heart healthy   Complete by:  As directed    Diet Carb Modified   Complete by:  As directed    Increase activity slowly   Complete by:  As directed      Allergies as of 10/14/2017      Reactions   Clonazepam Swelling      Medication List    STOP taking these medications   methocarbamol 500 MG tablet Commonly known as:  ROBAXIN     TAKE these medications   abacavir-dolutegravir-lamiVUDine 600-50-300 MG  tablet Commonly known as:  TRIUMEQ Take 1 tablet by mouth daily.   aspirin EC 81 MG tablet Take 1 tablet (81 mg total) by mouth daily.   atorvastatin 10 MG tablet Commonly known as:  LIPITOR Take 10 mg by mouth daily.   cyclobenzaprine 10 MG tablet Commonly known as:  FLEXERIL Take 10 mg by mouth at bedtime.   fenofibrate micronized 134 MG capsule Commonly known as:  LOFIBRA Take 134 mg by mouth daily before breakfast.   gabapentin 800 MG tablet Commonly known as:  NEURONTIN Take 800 mg by mouth 3 (three) times daily.   HYDROcodone-acetaminophen 5-325 MG tablet Commonly known as:  NORCO/VICODIN Take 1 tablet by mouth every 6 (six) hours as needed (pain).   LANTUS SOLOSTAR 100 UNIT/ML Solostar Pen Generic drug:  Insulin Glargine Inject 48 Units into the skin daily.   losartan-hydrochlorothiazide 50-12.5 MG tablet Commonly known as:  HYZAAR Take 1 tablet by mouth daily.   metFORMIN 1000 MG tablet Commonly known as:  GLUCOPHAGE Take 1,000 mg by mouth 2 (two) times daily with a meal.   omeprazole 40 MG capsule Commonly known as:  PRILOSEC Take 40 mg by mouth daily.   senna-docusate 8.6-50 MG tablet Commonly known as:  Senokot-S Take 1 tablet by mouth at bedtime as needed for mild constipation.   sitaGLIPtin 50 MG tablet Commonly known as:  JANUVIA Take 50 mg by mouth daily.   sulfamethoxazole-trimethoprim 400-80 MG tablet Commonly known as:  BACTRIM Take 1 tablet by mouth 2 (two) times daily.      Follow-up Information    Schedule an appointment as soon as possible for a visit with Donald Jakob, MD.   Specialty:  Orthopedic Surgery Why:  for approximately 2 weeks following surgery Contact information: [place] [address] [phone]        Donald Limbo, MD Follow up.   Specialty:  Family Medicine Why:  Please call his office to be seen on  Monday You will need the following blood work as you are on Bactrim: Advertising account executive  information: [place] [address] [place] [address] [phone]          Allergies  Allergen Reactions  . Clonazepam Swelling     Procedures/Studies:  4/15> L IF amputation through distal P2  Dg Chest 1 View  Result Date: 10/11/2017 CLINICAL DATA:  Preop EXAM: CHEST  1 VIEW COMPARISON:  08/17/2017 FINDINGS: The heart size and mediastinal contours are within normal limits. Both lungs are clear. The visualized skeletal structures are unremarkable. IMPRESSION: No active disease. Electronically Signed   By: Donavan Foil M.D.   On: 10/11/2017 20:16   Dg Finger Index Left  Result Date: 10/11/2017  CLINICAL DATA:  The patient suffered a laceration of the left index finger on a razor blade 1 week ago. Pain, redness and swelling. Initial encounter. EXAM: LEFT INDEX FINGER 2+V COMPARISON:  None. FINDINGS: Soft tissues of the index finger are swollen. There is bony destructive change throughout the distal phalanx consistent with osteomyelitis. No radiopaque foreign body is identified. IMPRESSION: Bony destructive change throughout the distal phalanx of the index finger consistent with osteomyelitis. Associated soft tissue swelling is noted. Electronically Signed   By: Inge Rise M.D.   On: 10/11/2017 12:51     The results of significant diagnostics from this hospitalization (including imaging, microbiology, ancillary and laboratory) are listed below for reference.     Microbiology: Recent Results (from the past 240 hour(s))  WOUND CULTURE     Status: Abnormal   Collection Time: 10/11/17 10:52 AM  Result Value Ref Range Status   MICRO NUMBER: [id number]  Final   SPECIMEN QUALITY: ADEQUATE  Final   SOURCE: FINGER  Final   STATUS: FINAL  Final   GRAM STAIN:   Final    No white blood cells seen No epithelial cells seen No organisms seen   ISOLATE 1: Streptococcus pyogenes (A)  Final    Comment: Moderate growth of Group A Streptococcus isolated  Beta-hemolytic Streptococci are predictably susceptible to penicillin and other beta-lactams. Susceptibility testing not routinely performed.  Blood culture (routine x 2)     Status: None (Preliminary result)   Collection Time: 10/11/17  4:41 PM  Result Value Ref Range Status   Specimen Description BLOOD RIGHT ANTECUBITAL  Final   Special Requests   Final    IN BOTH AEROBIC AND ANAEROBIC BOTTLES Blood Culture adequate volume   Culture   Final    NO GROWTH 2 DAYS Performed at [facility], [address], [place], [place] [address]    Report Status PENDING  Incomplete  Blood culture (routine x 2)     Status: None (Preliminary result)   Collection Time: 10/11/17  5:04 PM  Result Value Ref Range Status   Specimen Description BLOOD LEFT ANTECUBITAL  Final   Special Requests   Final    BOTTLES DRAWN AEROBIC AND ANAEROBIC Blood Culture adequate volume   Culture   Final    NO GROWTH 2 DAYS Performed at [facility], [place] [address] [place], [place] [address]    Report Status PENDING  Incomplete  Aerobic/Anaerobic Culture (surgical/deep wound)     Status: None (Preliminary result)   Collection Time: 10/11/17  9:11 PM  Result Value Ref Range Status   Specimen Description WOUND LEFT FINGER  Final   Special Requests LEFT INDEX FINGER AMPUTATION  Final   Gram Stain   Final    NO WBC SEEN NO ORGANISMS SEEN Performed at [facility], [address] [place], [place] [address]    Culture   Final    MODERATE METHICILLIN RESISTANT STAPHYLOCOCCUS AUREUS MODERATE STREPTOCOCCUS PYOGENES    Report Status PENDING  Incomplete   Organism ID, Bacteria METHICILLIN RESISTANT STAPHYLOCOCCUS AUREUS  Final      Susceptibility   Methicillin resistant staphylococcus aureus - MIC*    CIPROFLOXACIN >=8 RESISTANT Resistant     ERYTHROMYCIN <=0.25 SENSITIVE Sensitive     GENTAMICIN <=0.5 SENSITIVE Sensitive     OXACILLIN >=4 RESISTANT Resistant     TETRACYCLINE <=1 SENSITIVE Sensitive      VANCOMYCIN 1 SENSITIVE Sensitive     TRIMETH/SULFA <=10 SENSITIVE Sensitive  CLINDAMYCIN <=0.25 SENSITIVE Sensitive     RIFAMPIN <=0.5 SENSITIVE Sensitive     Inducible Clindamycin NEGATIVE Sensitive     * MODERATE METHICILLIN RESISTANT STAPHYLOCOCCUS AUREUS     Labs: BNP (last 3 results) No results for input(s): BNP in the last 8760 hours. Basic Metabolic Panel: Recent Labs  Lab 10/11/17 1049 10/11/17 1208 10/12/17 0807 10/13/17 0438  NA 138 138 134* 139  K 4.4 4.2 4.6 3.9  CL 106 106 106 107  CO2 24 22 22 24   GLUCOSE 106* 101* 267* 162*  BUN 36* 34* 31* 19  CREATININE 2.04* 1.93* 1.72* 1.44*  CALCIUM 9.6 9.4 8.4* 8.3*   Liver Function Tests: Recent Labs  Lab 10/11/17 1049 10/11/17 1208  AST 20 22  ALT 17 21  ALKPHOS  --  69  BILITOT 0.2 0.3  PROT 7.2 7.6  ALBUMIN  --  3.4*   No results for input(s): LIPASE, AMYLASE in the last 168 hours. No results for input(s): AMMONIA in the last 168 hours. CBC: Recent Labs  Lab 10/11/17 1049 10/11/17 1208 10/11/17 1651 10/12/17 0807 10/13/17 0438  WBC 9.7 11.2* 11.2* 9.0 12.2*  NEUTROABS  --  7.6 7.6  --   --   HGB 12.2* 12.3* 12.6* 10.9* 11.0*  HCT 37.5* 38.4* 39.0 34.1* 34.6*  MCV 83.7 87.3 86.5 86.8 87.6  PLT 407* 398 379 367 349   Cardiac Enzymes: No results for input(s): CKTOTAL, CKMB, CKMBINDEX, TROPONINI in the last 168 hours. BNP: Invalid input(s): POCBNP CBG: Recent Labs  Lab 10/13/17 0752 10/13/17 1229 10/13/17 1724 10/13/17 2111 10/14/17 0725  GLUCAP 95 140* 106* 174* 188*   D-Dimer No results for input(s): DDIMER in the last 72 hours. Hgb A1c No results for input(s): HGBA1C in the last 72 hours. Lipid Profile No results for input(s): CHOL, HDL, LDLCALC, TRIG, CHOLHDL, LDLDIRECT in the last 72 hours. Thyroid function studies No results for input(s): TSH, T4TOTAL, T3FREE, THYROIDAB in the last 72 hours.  Invalid input(s): FREET3 Anemia work up No results for input(s): VITAMINB12,  FOLATE, FERRITIN, TIBC, IRON, RETICCTPCT in the last 72 hours. Urinalysis No results found for: COLORURINE, APPEARANCEUR, [place], [place], [place], [place], [place], [place], PROTEINUR, UROBILINOGEN, NITRITE, LEUKOCYTESUR Sepsis Labs Invalid input(s): PROCALCITONIN,  WBC,  LACTICIDVEN Microbiology Recent Results (from the past 240 hour(s))  WOUND CULTURE     Status: Abnormal   Collection Time: 10/11/17 10:52 AM  Result Value Ref Range Status   MICRO NUMBER: [id number]  Final   SPECIMEN QUALITY: ADEQUATE  Final   SOURCE: FINGER  Final   STATUS: FINAL  Final   GRAM STAIN:   Final    No white blood cells seen No epithelial cells seen No organisms seen   ISOLATE 1: Streptococcus pyogenes (A)  Final    Comment: Moderate growth of Group A Streptococcus isolated Beta-hemolytic Streptococci are predictably susceptible to penicillin and other beta-lactams. Susceptibility testing not routinely performed.  Blood culture (routine x 2)     Status: None (Preliminary result)   Collection Time: 10/11/17  4:41 PM  Result Value Ref Range Status   Specimen Description BLOOD RIGHT ANTECUBITAL  Final   Special Requests   Final    IN BOTH AEROBIC AND ANAEROBIC BOTTLES Blood Culture adequate volume   Culture   Final    NO GROWTH 2 DAYS Performed at [facility], [address] [place], [place] [address]    Report Status PENDING  Incomplete  Blood culture (routine x 2)  Status: None (Preliminary result)   Collection Time: 10/11/17  5:04 PM  Result Value Ref Range Status   Specimen Description BLOOD LEFT ANTECUBITAL  Final   Special Requests   Final    BOTTLES DRAWN AEROBIC AND ANAEROBIC Blood Culture adequate volume   Culture   Final    NO GROWTH 2 DAYS Performed at [facility], [address] [place], [place] [address]    Report Status PENDING  Incomplete  Aerobic/Anaerobic Culture (surgical/deep wound)     Status: None (Preliminary result)   Collection Time: 10/11/17  9:11 PM   Result Value Ref Range Status   Specimen Description WOUND LEFT FINGER  Final   Special Requests LEFT INDEX FINGER AMPUTATION  Final   Gram Stain   Final    NO WBC SEEN NO ORGANISMS SEEN Performed at [facility], [address], [place],  [address]    Culture   Final    MODERATE METHICILLIN RESISTANT STAPHYLOCOCCUS AUREUS MODERATE STREPTOCOCCUS PYOGENES    Report Status PENDING  Incomplete   Organism ID, Bacteria METHICILLIN RESISTANT STAPHYLOCOCCUS AUREUS  Final      Susceptibility   Methicillin resistant staphylococcus aureus - MIC*    CIPROFLOXACIN >=8 RESISTANT Resistant     ERYTHROMYCIN <=0.25 SENSITIVE Sensitive     GENTAMICIN <=0.5 SENSITIVE Sensitive     OXACILLIN >=4 RESISTANT Resistant     TETRACYCLINE <=1 SENSITIVE Sensitive     VANCOMYCIN 1 SENSITIVE Sensitive     TRIMETH/SULFA <=10 SENSITIVE Sensitive     CLINDAMYCIN <=0.25 SENSITIVE Sensitive     RIFAMPIN <=0.5 SENSITIVE Sensitive     Inducible Clindamycin NEGATIVE Sensitive     * MODERATE METHICILLIN RESISTANT STAPHYLOCOCCUS AUREUS     Time coordinating discharge: 50 min  SIGNED:   Debbe Odea, MD  [facility] 10/14/2017, 11:19 AM Pager   If 7PM-7AM, please contact night-coverage [email or web address] Password [email or web address]

## 2017-10-14 NOTE — Progress Notes (Signed)
L IF amp thru P2 10-11-17  Sitting up, conversant, dressing removed by nursing already for me.  Pt c/o "whole hand" numb, but mostly IF and LF  Finger with intact sutures, no drainage, good nearly full AROM  Recs: 1. Cannot explain current c/o numbness as related to his infection or his surgery.  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 May now shower and re-dress wound daily (no peroxide, etc,--just soap and running water) 3. F/u approx 2 weeks with me for re-examination of wound  Micheline Rough, MD Hand Surgery Office [phone]

## 2017-10-14 NOTE — Social Work (Signed)
CSW received consult for transportation support for discharge. Pt needs assistance and cannot ride [employer] bus, confirmed address and provided with a cab voucher as pt is discharging today.   CSW signing off. Please consult if any additional needs arise.  Alexander Mt, [place] Work [phone]

## 2017-10-14 NOTE — Progress Notes (Signed)
Discharge home. Home discharge instruction given, no question verbalized. 

## 2017-10-14 NOTE — Care Management Note (Signed)
Case Management Note  Patient Details  Name: Linkin Vizzini MRN: [id number] Date of Birth: [birth year]  Subjective/Objective:                    Action/Plan: Continue to follow for discharge needs. [facility] following in case IV ABX needed at discharge.  Expected Discharge Date:                  Expected Discharge Plan:  [place]  In-House Referral:     Discharge planning Services  CM Consult  Post Acute Care Choice:  Home Health Choice offered to:     DME Arranged:    DME Agency:     HH Arranged:    [place] Agency:     Status of Service:  In process, will continue to follow  If discussed at Long Length of Stay Meetings, dates discussed:    Additional Comments:  Marilu Favre, RN 10/14/2017, 10:18 AM

## 2017-10-16 LAB — CULTURE, BLOOD (ROUTINE X 2)
Culture: NO GROWTH
Culture: NO GROWTH
SPECIAL REQUESTS: ADEQUATE

## 2017-10-16 LAB — AEROBIC/ANAEROBIC CULTURE W GRAM STAIN (SURGICAL/DEEP WOUND): Gram Stain: NONE SEEN

## 2017-10-16 LAB — AEROBIC/ANAEROBIC CULTURE (SURGICAL/DEEP WOUND)

## 2017-10-27 ENCOUNTER — Encounter: Payer: Medicare HMO | Attending: Physical Medicine & Rehabilitation | Admitting: Physical Medicine & Rehabilitation

## 2017-10-27 ENCOUNTER — Other Ambulatory Visit: Payer: Self-pay

## 2017-10-27 ENCOUNTER — Encounter: Payer: Self-pay | Admitting: Physical Medicine & Rehabilitation

## 2017-10-27 VITALS — BP 136/90 | HR 88 | Ht 67.5 in | Wt 166.2 lb

## 2017-10-27 DIAGNOSIS — Z89022 Acquired absence of left finger(s): Secondary | ICD-10-CM | POA: Diagnosis not present

## 2017-10-27 DIAGNOSIS — Z87442 Personal history of urinary calculi: Secondary | ICD-10-CM | POA: Insufficient documentation

## 2017-10-27 DIAGNOSIS — S68119A Complete traumatic metacarpophalangeal amputation of unspecified finger, initial encounter: Secondary | ICD-10-CM | POA: Diagnosis not present

## 2017-10-27 DIAGNOSIS — B20 Human immunodeficiency virus [HIV] disease: Secondary | ICD-10-CM

## 2017-10-27 DIAGNOSIS — F329 Major depressive disorder, single episode, unspecified: Secondary | ICD-10-CM | POA: Insufficient documentation

## 2017-10-27 DIAGNOSIS — F419 Anxiety disorder, unspecified: Secondary | ICD-10-CM | POA: Insufficient documentation

## 2017-10-27 DIAGNOSIS — G894 Chronic pain syndrome: Secondary | ICD-10-CM

## 2017-10-27 DIAGNOSIS — I6930 Unspecified sequelae of cerebral infarction: Secondary | ICD-10-CM

## 2017-10-27 DIAGNOSIS — E1340 Other specified diabetes mellitus with diabetic neuropathy, unspecified: Secondary | ICD-10-CM | POA: Diagnosis not present

## 2017-10-27 DIAGNOSIS — I10 Essential (primary) hypertension: Secondary | ICD-10-CM | POA: Insufficient documentation

## 2017-10-27 DIAGNOSIS — W19XXXA Unspecified fall, initial encounter: Secondary | ICD-10-CM

## 2017-10-27 DIAGNOSIS — Z823 Family history of stroke: Secondary | ICD-10-CM | POA: Diagnosis not present

## 2017-10-27 DIAGNOSIS — Z5181 Encounter for therapeutic drug level monitoring: Secondary | ICD-10-CM | POA: Diagnosis not present

## 2017-10-27 DIAGNOSIS — Z79891 Long term (current) use of opiate analgesic: Secondary | ICD-10-CM

## 2017-10-27 DIAGNOSIS — Z79899 Other long term (current) drug therapy: Secondary | ICD-10-CM | POA: Insufficient documentation

## 2017-10-27 DIAGNOSIS — R269 Unspecified abnormalities of gait and mobility: Secondary | ICD-10-CM | POA: Diagnosis not present

## 2017-10-27 DIAGNOSIS — Z21 Asymptomatic human immunodeficiency virus [HIV] infection status: Secondary | ICD-10-CM | POA: Insufficient documentation

## 2017-10-27 DIAGNOSIS — F17210 Nicotine dependence, cigarettes, uncomplicated: Secondary | ICD-10-CM | POA: Insufficient documentation

## 2017-10-27 DIAGNOSIS — Z820 Family history of epilepsy and other diseases of the nervous system: Secondary | ICD-10-CM | POA: Diagnosis not present

## 2017-10-27 DIAGNOSIS — G479 Sleep disorder, unspecified: Secondary | ICD-10-CM | POA: Diagnosis not present

## 2017-10-27 DIAGNOSIS — Z9181 History of falling: Secondary | ICD-10-CM | POA: Insufficient documentation

## 2017-10-27 DIAGNOSIS — I69398 Other sequelae of cerebral infarction: Secondary | ICD-10-CM | POA: Insufficient documentation

## 2017-10-27 DIAGNOSIS — G909 Disorder of the autonomic nervous system, unspecified: Secondary | ICD-10-CM

## 2017-10-27 DIAGNOSIS — E1140 Type 2 diabetes mellitus with diabetic neuropathy, unspecified: Secondary | ICD-10-CM | POA: Insufficient documentation

## 2017-10-27 MED ORDER — LIDOCAINE 5 % EX OINT: 1.0000 "application " | TOPICAL_OINTMENT | CUTANEOUS | 0 refills | Status: DC | PRN

## 2017-10-27 MED ORDER — AMITRIPTYLINE HCL 50 MG PO TABS: 50.0000 mg | ORAL_TABLET | Freq: Every day | ORAL | 1 refills | Status: DC
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
## 2017-11-03 ENCOUNTER — Ambulatory Visit (INDEPENDENT_AMBULATORY_CARE_PROVIDER_SITE_OTHER): Payer: Medicare HMO | Admitting: Infectious Diseases

## 2017-11-03 ENCOUNTER — Encounter: Payer: Self-pay | Admitting: Infectious Diseases

## 2017-11-03 VITALS — BP 130/86 | HR 91 | Temp 97.8°F | Ht 68.0 in | Wt 172.8 lb

## 2017-11-03 DIAGNOSIS — Z113 Encounter for screening for infections with a predominantly sexual mode of transmission: Secondary | ICD-10-CM

## 2017-11-03 DIAGNOSIS — Z21 Asymptomatic human immunodeficiency virus [HIV] infection status: Secondary | ICD-10-CM | POA: Diagnosis not present

## 2017-11-03 DIAGNOSIS — L02512 Cutaneous abscess of left hand: Secondary | ICD-10-CM

## 2017-11-03 DIAGNOSIS — Z79899 Other long term (current) drug therapy: Secondary | ICD-10-CM | POA: Diagnosis not present

## 2017-11-03 NOTE — Assessment & Plan Note (Signed)
He is doing well Will continue triumeq Will see him back in 4 months with labs prior.

## 2017-11-03 NOTE — Assessment & Plan Note (Signed)
Appears to be resolved Appreciate hand surgery f/u.

## 2017-11-03 NOTE — Progress Notes (Signed)
   Subjective:    Patient ID: Donald Ross, male    DOB: [birth year], 57 y.o.   MRN: [id number]  HPI 57 y.o. male with hx of HIV+ since 11-2003. He has been doing well on his ART (last triumeq).  He was dx with DM2 around that time as well. He has prev had a CVA (09-2015).  He presented to ID clinic 4-15 with pain and swelling, d/c from his L index finger after injuring it on a rusty razor. He was adm to hospital and underwent partial amputation of his finger on 4-15.  His Cx grew MRSA and group A strep.  He was treated with vanco/zosyn --> ancef---> oritavancin. D/c to home on 4-18.  Finger has been healing well, no d/c. No prox erythema or swelling.  Has surgical f/u today.    HIV 1 RNA Quant (copies/mL)  Date Value  10/12/2017 40  10/11/2017 <20 NOT DETECTED  04/12/2017 <20 NOT DETECTED   CD4 T Cell Abs (/uL)  Date Value  10/11/2017 890  04/12/2017 1,850  08/05/2016 1,710   Denies missed ART.   Review of Systems  Constitutional: Positive for fatigue. Negative for appetite change, chills and fever.  Gastrointestinal: Negative for constipation and diarrhea.  Genitourinary: Negative for difficulty urinating.  Skin: Positive for wound.  Please see HPI. All other systems reviewed and negative.      Objective:   Physical Exam  Constitutional: He appears well-developed and well-nourished.  HENT:  Mouth/Throat: No oropharyngeal exudate.  Eyes: Pupils are equal, round, and reactive to light. EOM are normal.  Neck: Normal range of motion. Neck supple.  Cardiovascular: Normal rate, regular rhythm and normal heart sounds.  Pulmonary/Chest: Effort normal and breath sounds normal.  Abdominal: Soft. Bowel sounds are normal. There is no tenderness.  Musculoskeletal: He exhibits no edema.   Wound is clean, no proximal erythema or swelling. No fluctulence.       Assessment & Plan:

## 2017-11-29 ENCOUNTER — Encounter (HOSPITAL_COMMUNITY): Payer: Self-pay | Admitting: Emergency Medicine

## 2017-11-29 ENCOUNTER — Emergency Department (HOSPITAL_COMMUNITY)
Admission: EM | Admit: 2017-11-29 | Discharge: 2017-11-29 | Disposition: A | Discharge status: Home / Self Care | Payer: Medicare HMO | Attending: Emergency Medicine | Admitting: Emergency Medicine

## 2017-11-29 DIAGNOSIS — E860 Dehydration: Secondary | ICD-10-CM | POA: Diagnosis not present

## 2017-11-29 DIAGNOSIS — N189 Chronic kidney disease, unspecified: Secondary | ICD-10-CM | POA: Diagnosis not present

## 2017-11-29 DIAGNOSIS — E1140 Type 2 diabetes mellitus with diabetic neuropathy, unspecified: Secondary | ICD-10-CM | POA: Diagnosis not present

## 2017-11-29 DIAGNOSIS — F17210 Nicotine dependence, cigarettes, uncomplicated: Secondary | ICD-10-CM | POA: Diagnosis not present

## 2017-11-29 DIAGNOSIS — R55 Syncope and collapse: Secondary | ICD-10-CM

## 2017-11-29 DIAGNOSIS — I129 Hypertensive chronic kidney disease with stage 1 through stage 4 chronic kidney disease, or unspecified chronic kidney disease: Secondary | ICD-10-CM | POA: Diagnosis not present

## 2017-11-29 DIAGNOSIS — E1122 Type 2 diabetes mellitus with diabetic chronic kidney disease: Secondary | ICD-10-CM | POA: Insufficient documentation

## 2017-11-29 DIAGNOSIS — Z79899 Other long term (current) drug therapy: Secondary | ICD-10-CM | POA: Diagnosis not present

## 2017-11-29 DIAGNOSIS — Z794 Long term (current) use of insulin: Secondary | ICD-10-CM | POA: Diagnosis not present

## 2017-11-29 LAB — CBC WITH DIFFERENTIAL/PLATELET
ABS IMMATURE GRANULOCYTES: 0 10*3/uL (ref 0.0–0.1)
BASOS ABS: 0.1 10*3/uL (ref 0.0–0.1)
Basophils Relative: 1 %
EOS PCT: 2 %
Eosinophils Absolute: 0.2 10*3/uL (ref 0.0–0.7)
HCT: 42 % (ref 39.0–52.0)
HEMOGLOBIN: 13.4 g/dL (ref 13.0–17.0)
Immature Granulocytes: 0 %
LYMPHS PCT: 28 %
Lymphs Abs: 2.9 10*3/uL (ref 0.7–4.0)
MCH: 28.2 pg (ref 26.0–34.0)
MCHC: 31.9 g/dL (ref 30.0–36.0)
MCV: 88.2 fL (ref 78.0–100.0)
MONO ABS: 0.6 10*3/uL (ref 0.1–1.0)
MONOS PCT: 6 %
NEUTROS ABS: 6.6 10*3/uL (ref 1.7–7.7)
Neutrophils Relative %: 63 %
Platelets: 322 10*3/uL (ref 150–400)
RBC: 4.76 MIL/uL (ref 4.22–5.81)
RDW: 15.3 % (ref 11.5–15.5)
WBC: 10.4 10*3/uL (ref 4.0–10.5)

## 2017-11-29 LAB — COMPREHENSIVE METABOLIC PANEL
ALK PHOS: 49 U/L (ref 38–126)
ALT: 12 U/L — ABNORMAL LOW (ref 17–63)
AST: 21 U/L (ref 15–41)
Albumin: 3.6 g/dL (ref 3.5–5.0)
Anion gap: 10 (ref 5–15)
BUN: 22 mg/dL — AB (ref 6–20)
CALCIUM: 9.1 mg/dL (ref 8.9–10.3)
CO2: 24 mmol/L (ref 22–32)
CREATININE: 1.9 mg/dL — AB (ref 0.61–1.24)
Chloride: 105 mmol/L (ref 101–111)
GFR, EST AFRICAN AMERICAN: 45 mL/min — AB (ref >60)
GFR, EST NON AFRICAN AMERICAN: 38 mL/min — AB (ref >60)
Glucose, Bld: 50 mg/dL — ABNORMAL LOW (ref 65–99)
Potassium: 3.3 mmol/L — ABNORMAL LOW (ref 3.5–5.1)
Sodium: 139 mmol/L (ref 135–145)
Total Bilirubin: 0.6 mg/dL (ref 0.3–1.2)
Total Protein: 7.4 g/dL (ref 6.5–8.1)

## 2017-11-29 LAB — ETHANOL

## 2017-11-29 LAB — TROPONIN I

## 2017-11-29 MED ORDER — SODIUM CHLORIDE 0.9 % IV BOLUS
1000.0000 mL | Freq: Once | INTRAVENOUS | Status: AC
  Administered 2017-11-29: 1000 mL via INTRAVENOUS

## 2017-11-29 NOTE — Discharge Instructions (Addendum)
As discussed, your evaluation today has been largely reassuring.  But, it is important that you monitor your condition carefully, and do not hesitate to return to the ED if you develop new, or concerning changes in your condition. ? ?Otherwise, please follow-up with your physician for appropriate ongoing care. ? ?

## 2017-11-29 NOTE — ED Notes (Signed)
Pt was given a taxi voucher per charge RN

## 2017-11-29 NOTE — ED Triage Notes (Addendum)
Pt arrives by [facility] from a gas station where he suddenly began to feel dizzy and faint, pt then sat down and called ems. Pt states he thought his blood sugar was low but with ems was within normal limits. Pt arrives to ED with a frontal headache. Pt has HX of stroke in 2016 which left sided numbness.  Pt has no new numbness, no weakness noted.

## 2017-11-29 NOTE — ED Provider Notes (Signed)
[facility] EMERGENCY DEPARTMENT Provider Note   CSN: [id number] Arrival date & time: 11/29/17  1696     History   Chief Complaint Chief Complaint  Patient presents with  . Near Syncope    HPI Donald Ross is a 57 y.o. male.  HPI  Patient presents after an episode of near syncope.  Patient is awake and alert, states that he was lightheaded, dizzy, without pain, and was brought here for evaluation. He states that he was in his usual state of health, with no recent medication, diet, activity changes prior to the episode. Episode was witnessed, and bystanders called EMS for the patient. He notes that he has been doing generally well, and on my exam states that he feels about normal, but with concern for hypoglycemia. He denies pain, confusion, disorientation, does have prior stroke, but has no new numbness or weakness.  Past Medical History:  Diagnosis Date  . Allergy   . Anxiety   . Depression   . Diabetes mellitus without complication ([place])   . GERD (gastroesophageal reflux disease)   . HIV infection ([place])   . Hypertension   . Kidney stones 2016  . Numbness   . Pain   . Stroke [facility]) 10/07/2015    Patient Active Problem List   Diagnosis Date Noted  . Abscess and osteomyelitis of left index finger 10/14/2017  . Abscess of finger of left hand 10/11/2017  . Osteomyelitis ([place]) 10/11/2017  . CKD (chronic kidney disease) 10/11/2017  . Drug abuse ([place]) 10/11/2017  . Hypertension 04/12/2017  . Pain 03/09/2017  . Neuropathy due to secondary diabetes ([place]) 08/05/2016  . Hyperlipidemia associated with type 2 diabetes mellitus ([place]) 08/05/2016  . Asymptomatic HIV infection ([place]) 04/03/2016  . Diabetes mellitus type 2 with complications ([place]) 78/93/8101  . CVA (cerebral vascular accident) ([place]) 04/03/2016  . Tobacco abuse 04/03/2016    Past Surgical History:  Procedure Laterality Date  . AMPUTATION Left 10/11/2017   Procedure: LEFT INDEX FINGER  AMPUTATION;  Surgeon: Milly Jakob, MD;  Location: [facility];  Service: Orthopedics;  Laterality: Left;  . EYE SURGERY Bilateral 1968  . LITHOTRIPSY  2016  . TENDON REPAIR Left 2016   thumb        Home Medications    Prior to Admission medications   Medication Sig Start Date End Date Taking? Authorizing Provider  abacavir-dolutegravir-lamiVUDine (TRIUMEQ) 600-50-300 MG tablet Take 1 tablet by mouth daily. 04/12/17   Campbell Riches, MD  amitriptyline (ELAVIL) 50 MG tablet Take 1 tablet (50 mg total) by mouth at bedtime. 10/27/17   Jamse Arn, MD  aspirin EC 81 MG tablet Take 1 tablet (81 mg total) by mouth daily. 10/14/17 11/13/17  Debbe Odea, MD  atorvastatin (LIPITOR) 10 MG tablet Take 10 mg by mouth daily. 09/16/16 06/15/18  [provider]  cyclobenzaprine (FLEXERIL) 10 MG tablet Take 10 mg by mouth at bedtime.    [provider]  fenofibrate micronized (LOFIBRA) 134 MG capsule Take 134 mg by mouth daily before breakfast. 03/09/16   [provider]  gabapentin (NEURONTIN) 800 MG tablet Take 800 mg by mouth 3 (three) times daily. 03/09/16   [provider]  HYDROcodone-acetaminophen (NORCO/VICODIN) 5-325 MG tablet Take 1 tablet by mouth every 6 (six) hours as needed (pain). 10/14/17   Debbe Odea, MD  Insulin Glargine (LANTUS SOLOSTAR) 100 UNIT/ML Solostar Pen Inject 48 Units into the skin daily.  05/04/16   [provider]  lidocaine (XYLOCAINE) 5 %  ointment Apply 1 application topically as needed. 10/27/17   Jamse Arn, MD  losartan-hydrochlorothiazide (HYZAAR) 50-12.5 MG tablet Take 1 tablet by mouth daily. 03/09/16   [provider]  metFORMIN (GLUCOPHAGE) 1000 MG tablet Take 1,000 mg by mouth 2 (two) times daily with a meal. 03/09/16   [provider]  omeprazole (PRILOSEC) 40 MG capsule Take 40 mg by mouth daily. 03/09/16   [provider]  senna-docusate (SENOKOT-S) 8.6-50 MG tablet Take 1 tablet by  mouth at bedtime as needed for mild constipation. 10/14/17   Debbe Odea, MD  sitaGLIPtin (JANUVIA) 50 MG tablet Take 50 mg by mouth daily.  05/04/16   [provider]  sulfamethoxazole-trimethoprim (BACTRIM) 400-80 MG tablet Take 1 tablet by mouth 2 (two) times daily. 10/11/17   Campbell Riches, MD    Family History Family History  Problem Relation Age of Onset  . CVA Mother        3 strokes  . Alzheimer's disease Mother   . Colon cancer Neg Hx     Social History Social History   Tobacco Use  . Smoking status: Current Every Day Smoker    Packs/day: 0.50    Years: 39.00    Pack years: 19.50    Types: Cigarettes    Start date: 04/03/1977  . Smokeless tobacco: Never Used  Substance Use Topics  . Alcohol use: No  . Drug use: Yes    Types: Cocaine    Comment: last used last night      Allergies   Clonazepam   Review of Systems Review of Systems  Constitutional:       Per HPI, otherwise negative  HENT:       Per HPI, otherwise negative  Respiratory:       Per HPI, otherwise negative  Cardiovascular:       Per HPI, otherwise negative  Gastrointestinal: Negative for vomiting.  Endocrine:       Negative aside from HPI  Genitourinary:       Neg aside from HPI   Musculoskeletal:       Per HPI, otherwise negative  Skin: Negative.   Allergic/Immunologic: Positive for immunocompromised state.  Neurological: Positive for light-headedness. Negative for syncope.     Physical Exam Updated Vital Signs BP 128/79   Pulse 85   Temp 98.3 F (36.8 C) (Oral)   Resp 15   SpO2 100%   Physical Exam  Constitutional: He is oriented to person, place, and time. He appears well-developed. No distress.  HENT:  Head: Normocephalic and atraumatic.  Eyes: Conjunctivae and EOM are normal.  Cardiovascular: Normal rate and regular rhythm.  Pulmonary/Chest: Effort normal. No stridor. No respiratory distress.  Abdominal: He exhibits no distension.  Musculoskeletal: He  exhibits no edema.  Neurological: He is alert and oriented to person, place, and time.  Skin: Skin is warm and dry.  Psychiatric: He has a normal mood and affect.  Nursing note and vitals reviewed.    ED Treatments / Results  Labs (all labs ordered are listed, but only abnormal results are displayed) Labs Reviewed  COMPREHENSIVE METABOLIC PANEL - Abnormal; Notable for the following components:      Result Value   Potassium 3.3 (*)    Glucose, Bld 50 (*)    BUN 22 (*)    Creatinine, Ser 1.90 (*)    ALT 12 (*)    GFR calc non Af Amer 38 (*)    GFR calc Af Amer 45 (*)  All other components within normal limits  ETHANOL  TROPONIN I  CBC WITH DIFFERENTIAL/PLATELET    EKG EKG Interpretation  Date/Time:  Monday November 29 2017 10:03:46 EDT Ventricular Rate:  91 PR Interval:    QRS Duration: 95 QT Interval:  370 QTC Calculation: 456 R Axis:   58 Text Interpretation:  Sinus rhythm Atrial premature complex Probable left atrial enlargement Anteroseptal infarct, old No significant change since last tracing Abnormal ekg Confirmed by Carmin Muskrat [phone]) on 11/29/2017 10:33:24 AM  Procedures Procedures (including critical care time)  Medications Ordered in ED Medications  sodium chloride 0.9 % bolus 1,000 mL (0 mLs Intravenous Stopped 11/29/17 1253)     Initial Impression / Assessment and Plan / ED Course  I have reviewed the triage vital signs and the nursing notes.  Pertinent labs & imaging results that were available during my care of the patient were reviewed by me and considered in my medical decision making (see chart for details).     1:08 PM Patient sleeping. Labs notable for slight elevated creatinine, though only more than chronic level, without acute kidney injury criteria. Consistent with mild dehydration, and given the patient's description of dizziness when standing upright, there is suspicion for volume status related near syncope, no evidence for cardiogenic,  neurogenic causes. Patient has been resting, with no additional episodes here, and after fluid resuscitation is appropriate for discharge.  Final Clinical Impressions(s) / ED Diagnoses   Final diagnoses:  Near syncope  Dehydration    ED Discharge Orders    None       Carmin Muskrat, MD 11/29/17 1309

## 2017-12-01 ENCOUNTER — Encounter: Payer: Medicare HMO | Attending: Physical Medicine & Rehabilitation | Admitting: Physical Medicine & Rehabilitation

## 2017-12-01 DIAGNOSIS — Z87442 Personal history of urinary calculi: Secondary | ICD-10-CM | POA: Insufficient documentation

## 2017-12-01 DIAGNOSIS — I10 Essential (primary) hypertension: Secondary | ICD-10-CM | POA: Insufficient documentation

## 2017-12-01 DIAGNOSIS — Z9181 History of falling: Secondary | ICD-10-CM | POA: Insufficient documentation

## 2017-12-01 DIAGNOSIS — G479 Sleep disorder, unspecified: Secondary | ICD-10-CM | POA: Insufficient documentation

## 2017-12-01 DIAGNOSIS — Z823 Family history of stroke: Secondary | ICD-10-CM | POA: Insufficient documentation

## 2017-12-01 DIAGNOSIS — Z21 Asymptomatic human immunodeficiency virus [HIV] infection status: Secondary | ICD-10-CM | POA: Insufficient documentation

## 2017-12-01 DIAGNOSIS — Z89022 Acquired absence of left finger(s): Secondary | ICD-10-CM | POA: Insufficient documentation

## 2017-12-01 DIAGNOSIS — I69398 Other sequelae of cerebral infarction: Secondary | ICD-10-CM | POA: Insufficient documentation

## 2017-12-01 DIAGNOSIS — F17210 Nicotine dependence, cigarettes, uncomplicated: Secondary | ICD-10-CM | POA: Insufficient documentation

## 2017-12-01 DIAGNOSIS — F419 Anxiety disorder, unspecified: Secondary | ICD-10-CM | POA: Insufficient documentation

## 2017-12-01 DIAGNOSIS — Z79899 Other long term (current) drug therapy: Secondary | ICD-10-CM | POA: Insufficient documentation

## 2017-12-01 DIAGNOSIS — Z820 Family history of epilepsy and other diseases of the nervous system: Secondary | ICD-10-CM | POA: Insufficient documentation

## 2017-12-01 DIAGNOSIS — F329 Major depressive disorder, single episode, unspecified: Secondary | ICD-10-CM | POA: Insufficient documentation

## 2017-12-01 DIAGNOSIS — R269 Unspecified abnormalities of gait and mobility: Secondary | ICD-10-CM | POA: Insufficient documentation

## 2017-12-01 DIAGNOSIS — E1140 Type 2 diabetes mellitus with diabetic neuropathy, unspecified: Secondary | ICD-10-CM | POA: Insufficient documentation

## 2017-12-21 ENCOUNTER — Other Ambulatory Visit: Payer: Self-pay | Admitting: Physical Medicine & Rehabilitation

## 2017-12-23 ENCOUNTER — Emergency Department (HOSPITAL_COMMUNITY)
Admission: EM | Admit: 2017-12-23 | Discharge: 2017-12-23 | Disposition: A | Discharge status: Home / Self Care | Payer: Medicare HMO | Attending: Emergency Medicine | Admitting: Emergency Medicine

## 2017-12-23 ENCOUNTER — Other Ambulatory Visit: Payer: Self-pay

## 2017-12-23 ENCOUNTER — Encounter (HOSPITAL_COMMUNITY): Payer: Self-pay

## 2017-12-23 DIAGNOSIS — I129 Hypertensive chronic kidney disease with stage 1 through stage 4 chronic kidney disease, or unspecified chronic kidney disease: Secondary | ICD-10-CM | POA: Insufficient documentation

## 2017-12-23 DIAGNOSIS — B20 Human immunodeficiency virus [HIV] disease: Secondary | ICD-10-CM | POA: Insufficient documentation

## 2017-12-23 DIAGNOSIS — Z79899 Other long term (current) drug therapy: Secondary | ICD-10-CM | POA: Insufficient documentation

## 2017-12-23 DIAGNOSIS — F17210 Nicotine dependence, cigarettes, uncomplicated: Secondary | ICD-10-CM | POA: Diagnosis not present

## 2017-12-23 DIAGNOSIS — E1122 Type 2 diabetes mellitus with diabetic chronic kidney disease: Secondary | ICD-10-CM | POA: Diagnosis not present

## 2017-12-23 DIAGNOSIS — L0291 Cutaneous abscess, unspecified: Secondary | ICD-10-CM

## 2017-12-23 DIAGNOSIS — Z794 Long term (current) use of insulin: Secondary | ICD-10-CM | POA: Insufficient documentation

## 2017-12-23 DIAGNOSIS — N189 Chronic kidney disease, unspecified: Secondary | ICD-10-CM | POA: Insufficient documentation

## 2017-12-23 DIAGNOSIS — L02213 Cutaneous abscess of chest wall: Secondary | ICD-10-CM | POA: Insufficient documentation

## 2017-12-23 DIAGNOSIS — Z8673 Personal history of transient ischemic attack (TIA), and cerebral infarction without residual deficits: Secondary | ICD-10-CM | POA: Diagnosis not present

## 2017-12-23 DIAGNOSIS — H5789 Other specified disorders of eye and adnexa: Secondary | ICD-10-CM | POA: Diagnosis present

## 2017-12-23 DIAGNOSIS — H1031 Unspecified acute conjunctivitis, right eye: Secondary | ICD-10-CM | POA: Diagnosis not present

## 2017-12-23 LAB — CBG MONITORING, ED: Glucose-Capillary: 124 mg/dL — ABNORMAL HIGH (ref 70–99)

## 2017-12-23 MED ORDER — TETRACAINE HCL 0.5 % OP SOLN
2.0000 [drp] | Freq: Once | OPHTHALMIC | Status: AC
Start: 2017-12-23 — End: 2017-12-23
  Administered 2017-12-23: 2 [drp] via OPHTHALMIC
  Filled 2017-12-23: qty 4

## 2017-12-23 MED ORDER — LIDOCAINE-EPINEPHRINE (PF) 2 %-1:200000 IJ SOLN
20.0000 mL | Freq: Once | INTRAMUSCULAR | Status: AC
  Administered 2017-12-23: 20 mL
  Filled 2017-12-23: qty 20

## 2017-12-23 MED ORDER — OFLOXACIN 0.3 % OP SOLN: 2.0000 [drp] | Freq: Four times a day (QID) | OPHTHALMIC | 0 refills | Status: DC

## 2017-12-23 MED ORDER — FLUORESCEIN SODIUM 1 MG OP STRP
1.0000 | ORAL_STRIP | Freq: Once | OPHTHALMIC | Status: AC
  Administered 2017-12-23: 1 via OPHTHALMIC
  Filled 2017-12-23: qty 1

## 2017-12-23 MED ORDER — DOXYCYCLINE HYCLATE 100 MG PO CAPS: 100.0000 mg | ORAL_CAPSULE | Freq: Two times a day (BID) | ORAL | 0 refills | Status: DC

## 2017-12-23 NOTE — ED Notes (Signed)
CBG reported to [facility]

## 2017-12-23 NOTE — ED Notes (Signed)
Patient able to ambulate independently  

## 2017-12-23 NOTE — ED Notes (Signed)
PA at bedside.

## 2017-12-23 NOTE — Discharge Instructions (Addendum)
Take antibiotics as prescribed. Take the entire course, even if your symptoms improve.  Use eye drops 4 times a day.  Follow up with your primary care doctor early next week for recheck of your abscess.  Keep the area covered and clean, washing once a day with soap and water.  Follow up with your eye doctor for your scheduled appointment.  Return to the ER if you develop fevers, vomiting, vision loss, inability/pain with movement of your eye, or any new or concerning symptoms.

## 2017-12-23 NOTE — ED Triage Notes (Signed)
Pt reports right eye irritation and drainage. He reports he wakes up and his eye is shut closed from drainage. He also reports abscess to right axilla. He states it started as a bug bite but has gotten progressively worse.

## 2017-12-23 NOTE — ED Provider Notes (Signed)
[place] EMERGENCY DEPARTMENT Provider Note   CSN: [id number] Arrival date & time: 12/23/17  1107     History   Chief Complaint Chief Complaint  Patient presents with  . Abscess  . Eye Problem    HPI Donald Ross is a 57 y.o. male presenting for evaluation of right eye redness/itching and right lateral chest abscess.  Patient states for the past 2 days, he has had itching, redness, and drainage of his right eye.  He states that he wakes up, his eyes crusted shut.  He denies injury to the eye.  He states it feels irritated, but is not acutely painful.  He denies difficulty moving his eyes.  No vision changes.  He wears glasses normally, but does not have them with him.  He does not wear contacts.  No symptoms of the left eye.  No ear pain, nasal congestion, sore throat, fevers, or chills.  He has an appointment with his eye doctor next week for his annual exam. Additionally, patient reports 5-day history of left lateral chest abscess.  It started as a small spot which he thought was a bug bite, and has grown larger and more painful.  He reports drainage from it.  He denies extension into his shoulder.  He denies difficulty breathing or chest pain.  He states it hurts with palpation, nothing makes it better.  He has not been taking anything for it including Tylenol or ibuprofen.  Patient has a history of diabetes and HIV, both of which are well controlled.   HPI  Past Medical History:  Diagnosis Date  . Allergy   . Anxiety   . Depression   . Diabetes mellitus without complication ([place])   . GERD (gastroesophageal reflux disease)   . HIV infection ([place])   . Hypertension   . Kidney stones 2016  . Numbness   . Pain   . Stroke [facility]) 10/07/2015    Patient Active Problem List   Diagnosis Date Noted  . Abscess and osteomyelitis of left index finger 10/14/2017  . Abscess of finger of left hand 10/11/2017  . Osteomyelitis ([place]) 10/11/2017  . CKD (chronic kidney  disease) 10/11/2017  . Drug abuse ([place]) 10/11/2017  . Hypertension 04/12/2017  . Pain 03/09/2017  . Neuropathy due to secondary diabetes ([place]) 08/05/2016  . Hyperlipidemia associated with type 2 diabetes mellitus ([place]) 08/05/2016  . Asymptomatic HIV infection ([place]) 04/03/2016  . Diabetes mellitus type 2 with complications ([place]) 65/68/1275  . CVA (cerebral vascular accident) ([place]) 04/03/2016  . Tobacco abuse 04/03/2016    Past Surgical History:  Procedure Laterality Date  . AMPUTATION Left 10/11/2017   Procedure: LEFT INDEX FINGER AMPUTATION;  Surgeon: Milly Jakob, MD;  Location: [facility];  Service: Orthopedics;  Laterality: Left;  . EYE SURGERY Bilateral 1968  . LITHOTRIPSY  2016  . TENDON REPAIR Left 2016   thumb        Home Medications    Prior to Admission medications   Medication Sig Start Date End Date Taking? Authorizing Provider  abacavir-dolutegravir-lamiVUDine (TRIUMEQ) 600-50-300 MG tablet Take 1 tablet by mouth daily. 04/12/17  Yes Campbell Riches, MD  atorvastatin (LIPITOR) 10 MG tablet Take 10 mg by mouth daily. 09/16/16 06/15/18 Yes [provider]  cyclobenzaprine (FLEXERIL) 10 MG tablet Take 10 mg by mouth at bedtime.   Yes [provider]  fenofibrate micronized (LOFIBRA) 134 MG capsule Take 134 mg by mouth daily before breakfast. 03/09/16  Yes [provider]  gabapentin (NEURONTIN) 800 MG tablet Take 800 mg by mouth 3 (three) times daily. 03/09/16  Yes [provider]  HYDROcodone-acetaminophen (NORCO/VICODIN) 5-325 MG tablet Take 1 tablet by mouth every 6 (six) hours as needed (pain). 10/14/17  Yes Debbe Odea, MD  Insulin Glargine (LANTUS SOLOSTAR) 100 UNIT/ML Solostar Pen Inject 48 Units into the skin daily.  05/04/16  Yes [provider]  losartan-hydrochlorothiazide (HYZAAR) 50-12.5 MG tablet Take 1 tablet by mouth daily. 03/09/16  Yes [provider]  metFORMIN (GLUCOPHAGE) 500 MG tablet Take 500 mg by  mouth 2 (two) times daily with a meal.  03/09/16  Yes [provider]  omeprazole (PRILOSEC) 40 MG capsule Take 40 mg by mouth daily. 03/09/16  Yes [provider]  sitaGLIPtin (JANUVIA) 50 MG tablet Take 50 mg by mouth daily.  05/04/16  Yes [provider]  tetrahydrozoline 0.05 % ophthalmic solution Place 1 drop into the right eye daily as needed (redness/irritation).   Yes [provider]  amitriptyline (ELAVIL) 50 MG tablet TAKE 1 TABLET BY MOUTH EVERYDAY AT BEDTIME Patient not taking: Reported on 12/23/2017 12/21/17   Jamse Arn, MD  aspirin EC 81 MG tablet Take 1 tablet (81 mg total) by mouth daily. Patient not taking: Reported on 12/23/2017 10/14/17 11/13/17  Debbe Odea, MD  doxycycline (VIBRAMYCIN) 100 MG capsule Take 1 capsule (100 mg total) by mouth 2 (two) times daily. 12/23/17   Ireoluwa Grant, PA-C  lidocaine (XYLOCAINE) 5 % ointment Apply 1 application topically as needed. Patient not taking: Reported on 12/23/2017 10/27/17   Jamse Arn, MD  ofloxacin (OCUFLOX) 0.3 % ophthalmic solution Place 2 drops into the right eye 4 (four) times daily. 12/23/17   Kadien Lineman, PA-C  senna-docusate (SENOKOT-S) 8.6-50 MG tablet Take 1 tablet by mouth at bedtime as needed for mild constipation. Patient not taking: Reported on 12/23/2017 10/14/17   Debbe Odea, MD    Family History Family History  Problem Relation Age of Onset  . CVA Mother        3 strokes  . Alzheimer's disease Mother   . Colon cancer Neg Hx     Social History Social History   Tobacco Use  . Smoking status: Current Every Day Smoker    Packs/day: 0.50    Years: 39.00    Pack years: 19.50    Types: Cigarettes    Start date: 04/03/1977  . Smokeless tobacco: Never Used  Substance Use Topics  . Alcohol use: No  . Drug use: Yes    Types: Cocaine    Comment: last used last night      Allergies   Clonazepam   Review of Systems Review of Systems  Constitutional:  Negative for fever.  Eyes: Positive for discharge, redness and itching. Negative for photophobia, pain and visual disturbance.  Skin: Positive for color change.  All other systems reviewed and are negative.    Physical Exam Updated Vital Signs BP (!) 144/89 (BP Location: Left Arm)   Pulse 97   Temp 98 F (36.7 C) (Oral)   Resp 16   SpO2 97%   Physical Exam  Constitutional: He is oriented to person, place, and time. He appears well-developed and well-nourished. No distress.  Appears in no distress  HENT:  Head: Normocephalic and atraumatic.  Right Ear: Tympanic membrane, external ear and ear canal normal.  Left Ear: Tympanic membrane, external ear and ear canal normal.  Nose: Nose normal.  Mouth/Throat: Uvula is midline, oropharynx is clear  and moist and mucous membranes are normal.  Eyes: Pupils are equal, round, and reactive to light. EOM and lids are normal. Right conjunctiva is injected.  Slit lamp exam:      The right eye shows no corneal abrasion, no corneal ulcer and no fluorescein uptake.       The left eye shows no corneal ulcer.  Injection of the right conjunctiva without chemosis.  No peri-orbital swelling.  EOMI and PERRLA.  No nystagmus. tonopen 16 on R, 18 on L. Visual acuity equal bilaterally. No fluorescein stain uptake or signs of corneal abrasion.   Neck: Normal range of motion. Neck supple.  Cardiovascular: Normal rate, regular rhythm and intact distal pulses.  Pulmonary/Chest: Effort normal and breath sounds normal. No respiratory distress. He has no wheezes.  Abdominal: Soft. He exhibits no distension and no mass. There is no tenderness. There is no guarding.  Musculoskeletal: Normal range of motion.  Neurological: He is alert and oriented to person, place, and time.  Skin: Skin is warm and dry. Capillary refill takes less than 2 seconds. There is erythema.  Tender fluctuant abscess of the right lateral chest wall without active drainage.  Mild surrounding  skin erythema.  No streaking.  Psychiatric: He has a normal mood and affect.  Nursing note and vitals reviewed.    ED Treatments / Results  Labs (all labs ordered are listed, but only abnormal results are displayed) Labs Reviewed  CBG MONITORING, ED - Abnormal; Notable for the following components:      Result Value   Glucose-Capillary 124 (*)    All other components within normal limits    EKG None  Radiology No results found.  Procedures .Marland KitchenIncision and Drainage Date/Time: 12/23/2017 1:32 PM Performed by: Franchot Heidelberg, PA-C Authorized by: Franchot Heidelberg, PA-C   Consent:    Consent obtained:  Verbal   Consent given by:  Patient   Risks discussed:  Bleeding, incomplete drainage, infection and pain Location:    Type:  Abscess   Location:  Trunk   Trunk location:  Chest Pre-procedure details:    Skin preparation:  Chloraprep Anesthesia (see MAR for exact dosages):    Anesthesia method:  Local infiltration   Local anesthetic:  Lidocaine 2% WITH epi Procedure type:    Complexity:  Simple Procedure details:    Needle aspiration: no     Incision types:  Single straight   Incision depth:  Dermal   Scalpel blade:  11   Wound management:  Probed and deloculated and irrigated with saline   Drainage:  Purulent and serosanguinous   Drainage amount:  Moderate   Wound treatment:  Wound left open Post-procedure details:    Patient tolerance of procedure:  Tolerated well, no immediate complications   (including critical care time)  Medications Ordered in ED Medications  fluorescein ophthalmic strip 1 strip (1 strip Both Eyes Given 12/23/17 1150)  tetracaine (PONTOCAINE) 0.5 % ophthalmic solution 2 drop (2 drops Both Eyes Given 12/23/17 1150)  lidocaine-EPINEPHrine (XYLOCAINE W/EPI) 2 %-1:200000 (PF) injection 20 mL (20 mLs Infiltration Given 12/23/17 1150)     Initial Impression / Assessment and Plan / ED Course  I have reviewed the triage vital signs and the  nursing notes.  Pertinent labs & imaging results that were available during my care of the patient were reviewed by me and considered in my medical decision making (see chart for details).     Patient presented for evaluation of right eye irritation and abscess of the  right side.  Physical exam reassuring, patient is afebrile not tachycardic.  Appears nontoxic.  Initial vitals elevated at 103, this was resolved by the time I was in the room.  Eye exam reassuring, visual acuity equal, Tono-Pen reassuring, no fluorescein stain up stain uptake.  Likely conjunctivitis, but as patient is at high risk of both diabetes and HIV, and has daily crusting, will treat with antibiotic gtts.  I&D performed, and purulent material expressed.  Aftercare instructions given.  Will place on antibiotics due to high risk for infection.  Discussed wound recheck with his PCP on Monday.  Patient to follow-up with his eye doctor next week as scheduled.  Discussed strict return precautions including worsening signs of infection or concerning red flags or eye pain.  At this time, patient appears safe for discharge  Patient states he understands and agrees to plan.   Final Clinical Impressions(s) / ED Diagnoses   Final diagnoses:  Acute conjunctivitis of right eye, unspecified acute conjunctivitis type  Abscess    ED Discharge Orders        Ordered    doxycycline (VIBRAMYCIN) 100 MG capsule  2 times daily     12/23/17 1251    ofloxacin (OCUFLOX) 0.3 % ophthalmic solution  4 times daily     12/23/17 1251       Masaji Billups, PA-C 12/23/17 1334    Fredia Sorrow, MD 12/27/17 1842

## 2018-01-12 ENCOUNTER — Other Ambulatory Visit: Payer: Self-pay | Admitting: Physical Medicine & Rehabilitation

## 2018-02-12 ENCOUNTER — Other Ambulatory Visit: Payer: Self-pay | Admitting: Physical Medicine & Rehabilitation

## 2018-03-08 ENCOUNTER — Telehealth: Payer: Self-pay

## 2018-03-08 ENCOUNTER — Encounter: Payer: Self-pay | Admitting: Infectious Diseases

## 2018-03-08 ENCOUNTER — Ambulatory Visit (INDEPENDENT_AMBULATORY_CARE_PROVIDER_SITE_OTHER): Payer: Medicare HMO | Admitting: Infectious Diseases

## 2018-03-08 VITALS — BP 134/84 | HR 97 | Temp 97.8°F | Wt 186.0 lb

## 2018-03-08 DIAGNOSIS — F329 Major depressive disorder, single episode, unspecified: Secondary | ICD-10-CM

## 2018-03-08 DIAGNOSIS — Z21 Asymptomatic human immunodeficiency virus [HIV] infection status: Secondary | ICD-10-CM

## 2018-03-08 DIAGNOSIS — Z79899 Other long term (current) drug therapy: Secondary | ICD-10-CM

## 2018-03-08 DIAGNOSIS — N183 Chronic kidney disease, stage 3 unspecified: Secondary | ICD-10-CM

## 2018-03-08 DIAGNOSIS — Z720 Tobacco use: Secondary | ICD-10-CM | POA: Diagnosis not present

## 2018-03-08 DIAGNOSIS — F32A Depression, unspecified: Secondary | ICD-10-CM | POA: Insufficient documentation

## 2018-03-08 DIAGNOSIS — E118 Type 2 diabetes mellitus with unspecified complications: Secondary | ICD-10-CM | POA: Diagnosis not present

## 2018-03-08 DIAGNOSIS — Z23 Encounter for immunization: Secondary | ICD-10-CM

## 2018-03-08 DIAGNOSIS — Z113 Encounter for screening for infections with a predominantly sexual mode of transmission: Secondary | ICD-10-CM

## 2018-03-08 NOTE — Progress Notes (Signed)
   Subjective:    Patient ID: Donald Ross, male    DOB: 23-Apr-1961, 57 y.o.   MRN: [id number]  HPI 57 y.o.malewith hx of HIV+ since 11-2003. He has been doing well on his ART (last triumeq).  He was dx with DM2 around that time as well. He has prev had a CVA (09-2015).  He presented to ID clinic 4-15 with pain and swelling, d/c from his L index finger after injuring it on a rusty razor. He was adm to hospital and underwent partial amputation of his finger on 4-15.  His Cx grew MRSA and group A strep  Today he feels like he is "deteriorating". Worsened memory, continued aching with adjustment of lyrica. Having problems with erectile dysfunction.  Lost his lantus when moving, has been off for 1 month. Getting refilled today.  No problems with triumeq.   HIV 1 RNA Quant (copies/mL)  Date Value  10/12/2017 40  10/11/2017 <20 NOT DETECTED  04/12/2017 <20 NOT DETECTED   CD4 T Cell Abs (/uL)  Date Value  10/11/2017 890  04/12/2017 1,850  08/05/2016 1,710     Review of Systems  Constitutional: Negative for appetite change and unexpected weight change.  Respiratory: Negative for cough and shortness of breath.   Gastrointestinal: Negative for constipation and diarrhea.  Genitourinary: Positive for urgency. Negative for difficulty urinating.  Psychiatric/Behavioral: Positive for dysphoric mood.  Please see HPI. All other systems reviewed and negative.     Objective:   Physical Exam  Constitutional: He is oriented to person, place, and time. He appears well-developed and well-nourished.  HENT:  Mouth/Throat: No oropharyngeal exudate.  Eyes: Pupils are equal, round, and reactive to light. EOM are normal.  Neck: Normal range of motion. Neck supple.  Cardiovascular: Normal rate, regular rhythm and normal heart sounds.  Pulmonary/Chest: Effort normal and breath sounds normal. No respiratory distress.  Abdominal: Soft. Bowel sounds are normal. He exhibits no distension. There is no  tenderness.  Musculoskeletal: He exhibits no edema.  Neurological: He is alert and oriented to person, place, and time.  Psychiatric: He exhibits a depressed mood.      Assessment & Plan:

## 2018-03-08 NOTE — Telephone Encounter (Signed)
Called patient with results from CBG. Patient finger stick resulted a 581, Dr. Johnnye Sima was informed after results for advice. Patient left before advice could be given. Dr. Johnnye Sima would like for patient to go to Urgent Care of ED. Called patient to relay message to go to Urgent Care or ED due to his CBG level being high. Patient refused to go to either. States that he will be picking up his insulin today and will wait a couple of days before going to Urgent Care. Stressed further to the patient that he should go to urgent care for treatment, patient is refusing at this time.  [place]

## 2018-03-08 NOTE — Assessment & Plan Note (Signed)
Continues to smoke, encouraged to stop.

## 2018-03-08 NOTE — Assessment & Plan Note (Signed)
He appears to be doing well Offered/refused condoms.  Got flu shot on 9-5 PCV today Will see him back in 6 months.

## 2018-03-08 NOTE — Assessment & Plan Note (Signed)
Cr variable 1.44 to 1.93 Will continue to watch.

## 2018-03-08 NOTE — Assessment & Plan Note (Addendum)
He will f/u with his PCP He has a number of difficulties- he has a broken glucometer, he has been out of insulin.  His throat is dry today.  Will check his FSG today  Add- His FSG is 560. He is being sent to urgent care. He refuses and leaves clinic.

## 2018-03-08 NOTE — Assessment & Plan Note (Signed)
I asked if he wanted to see Rollene Fare and he deferred to a later date.

## 2018-03-18 ENCOUNTER — Emergency Department (HOSPITAL_COMMUNITY)
Admission: EM | Admit: 2018-03-18 | Discharge: 2018-03-18 | Disposition: A | Discharge status: Home / Self Care | Payer: Medicare HMO | Attending: Emergency Medicine | Admitting: Emergency Medicine

## 2018-03-18 ENCOUNTER — Encounter (HOSPITAL_COMMUNITY): Payer: Self-pay | Admitting: Emergency Medicine

## 2018-03-18 ENCOUNTER — Emergency Department (HOSPITAL_COMMUNITY): Payer: Medicare HMO

## 2018-03-18 DIAGNOSIS — Y939 Activity, unspecified: Secondary | ICD-10-CM | POA: Diagnosis not present

## 2018-03-18 DIAGNOSIS — E1122 Type 2 diabetes mellitus with diabetic chronic kidney disease: Secondary | ICD-10-CM | POA: Insufficient documentation

## 2018-03-18 DIAGNOSIS — F17210 Nicotine dependence, cigarettes, uncomplicated: Secondary | ICD-10-CM | POA: Insufficient documentation

## 2018-03-18 DIAGNOSIS — S60511A Abrasion of right hand, initial encounter: Secondary | ICD-10-CM | POA: Diagnosis not present

## 2018-03-18 DIAGNOSIS — Z79899 Other long term (current) drug therapy: Secondary | ICD-10-CM | POA: Diagnosis not present

## 2018-03-18 DIAGNOSIS — M5432 Sciatica, left side: Secondary | ICD-10-CM | POA: Diagnosis not present

## 2018-03-18 DIAGNOSIS — Z794 Long term (current) use of insulin: Secondary | ICD-10-CM | POA: Diagnosis not present

## 2018-03-18 DIAGNOSIS — I129 Hypertensive chronic kidney disease with stage 1 through stage 4 chronic kidney disease, or unspecified chronic kidney disease: Secondary | ICD-10-CM | POA: Diagnosis not present

## 2018-03-18 DIAGNOSIS — T148XXA Other injury of unspecified body region, initial encounter: Secondary | ICD-10-CM

## 2018-03-18 DIAGNOSIS — X58XXXA Exposure to other specified factors, initial encounter: Secondary | ICD-10-CM | POA: Diagnosis not present

## 2018-03-18 DIAGNOSIS — N189 Chronic kidney disease, unspecified: Secondary | ICD-10-CM | POA: Insufficient documentation

## 2018-03-18 DIAGNOSIS — Y999 Unspecified external cause status: Secondary | ICD-10-CM | POA: Insufficient documentation

## 2018-03-18 DIAGNOSIS — S6991XA Unspecified injury of right wrist, hand and finger(s), initial encounter: Secondary | ICD-10-CM | POA: Diagnosis present

## 2018-03-18 DIAGNOSIS — Y929 Unspecified place or not applicable: Secondary | ICD-10-CM | POA: Insufficient documentation

## 2018-03-18 LAB — CBG MONITORING, ED: Glucose-Capillary: 236 mg/dL — ABNORMAL HIGH (ref 70–99)

## 2018-03-18 MED ORDER — NAPROXEN 250 MG PO TABS
375.0000 mg | ORAL_TABLET | Freq: Once | ORAL | Status: AC
  Administered 2018-03-18: 375 mg via ORAL
  Filled 2018-03-18: qty 2

## 2018-03-18 MED ORDER — NAPROXEN 500 MG PO TABS: 500.0000 mg | ORAL_TABLET | Freq: Two times a day (BID) | ORAL | 0 refills | Status: DC

## 2018-03-18 MED ORDER — LIDOCAINE 5 % EX PTCH: 1.0000 | MEDICATED_PATCH | CUTANEOUS | 0 refills | Status: AC

## 2018-03-18 MED ORDER — DOXYCYCLINE HYCLATE 100 MG PO TABS
100.0000 mg | ORAL_TABLET | Freq: Once | ORAL | Status: AC
  Administered 2018-03-18: 100 mg via ORAL
  Filled 2018-03-18: qty 1

## 2018-03-18 MED ORDER — BACITRACIN ZINC 500 UNIT/GM EX OINT
TOPICAL_OINTMENT | Freq: Two times a day (BID) | CUTANEOUS | Status: DC
  Administered 2018-03-18: 1 via TOPICAL
  Filled 2018-03-18: qty 0.9

## 2018-03-18 MED ORDER — LIDOCAINE 5 % EX PTCH
1.0000 | MEDICATED_PATCH | CUTANEOUS | Status: DC
  Administered 2018-03-18: 1 via TRANSDERMAL
  Filled 2018-03-18: qty 1

## 2018-03-18 NOTE — ED Notes (Signed)
Pt discharged from ED; instructions provided and scripts given; Pt encouraged to return to ED if symptoms worsen and to f/u with PCP; Pt verbalized understanding of all instructions 

## 2018-03-18 NOTE — ED Provider Notes (Signed)
[facility]ERGENCY DEPARTMENT Provider Note   CSN: [id number] Arrival date & time: 03/18/18  0147     History   Chief Complaint Chief Complaint  Patient presents with  . Abrasion  . Hip Pain  . Blister    HPI Donald Ross is a 57 y.o. male.  The history is provided by the patient.  Hip Pain  This is a chronic problem. The current episode started more than 1 week ago. The problem occurs constantly. The problem has not changed since onset.Pertinent negatives include no chest pain, no abdominal pain, no headaches and no shortness of breath. Nothing aggravates the symptoms. Nothing relieves the symptoms. The treatment provided no relief.  Pain on the left buttocks wrapping around the left hip.  Also has a abrasion over the right third knuckle that hasn't healed in a week.    Past Medical History:  Diagnosis Date  . Allergy   . Anxiety   . Depression   . Diabetes mellitus without complication ([place])   . GERD (gastroesophageal reflux disease)   . HIV infection ([place])   . Hypertension   . Kidney stones 2016  . Numbness   . Pain   . Stroke [facility]) 10/07/2015    Patient Active Problem List   Diagnosis Date Noted  . Depression 03/08/2018  . Abscess and osteomyelitis of left index finger 10/14/2017  . Abscess of finger of left hand 10/11/2017  . Osteomyelitis ([place]) 10/11/2017  . CKD (chronic kidney disease) 10/11/2017  . Drug abuse ([place]) 10/11/2017  . Hypertension 04/12/2017  . Pain 03/09/2017  . Neuropathy due to secondary diabetes ([place]) 08/05/2016  . Hyperlipidemia associated with type 2 diabetes mellitus ([place]) 08/05/2016  . Asymptomatic HIV infection ([place]) 04/03/2016  . Diabetes mellitus type 2 with complications ([place]) 52/84/1324  . CVA (cerebral vascular accident) ([place]) 04/03/2016  . Tobacco abuse 04/03/2016    Past Surgical History:  Procedure Laterality Date  . AMPUTATION Left 10/11/2017   Procedure: LEFT INDEX FINGER AMPUTATION;  Surgeon:  Milly Jakob, MD;  Location: [facility];  Service: Orthopedics;  Laterality: Left;  . EYE SURGERY Bilateral 1968  . LITHOTRIPSY  2016  . TENDON REPAIR Left 2016   thumb        Home Medications    Prior to Admission medications   Medication Sig Start Date End Date Taking? Authorizing Provider  abacavir-dolutegravir-lamiVUDine (TRIUMEQ) 600-50-300 MG tablet Take 1 tablet by mouth daily. 04/12/17   Campbell Riches, MD  amitriptyline (ELAVIL) 50 MG tablet TAKE 1 TABLET BY MOUTH EVERYDAY AT BEDTIME 02/14/18   Jamse Arn, MD  aspirin EC 81 MG tablet Take 1 tablet (81 mg total) by mouth daily. Patient not taking: Reported on 12/23/2017 10/14/17 11/13/17  Debbe Odea, MD  atorvastatin (LIPITOR) 10 MG tablet Take 10 mg by mouth daily. 09/16/16 06/15/18  [provider]  cyclobenzaprine (FLEXERIL) 10 MG tablet Take 10 mg by mouth at bedtime.    [provider]  fenofibrate micronized (LOFIBRA) 134 MG capsule Take 134 mg by mouth daily before breakfast. 03/09/16   [provider]  gabapentin (NEURONTIN) 800 MG tablet Take 800 mg by mouth 3 (three) times daily. 03/09/16   [provider]  HYDROcodone-acetaminophen (NORCO/VICODIN) 5-325 MG tablet Take 1 tablet by mouth every 6 (six) hours as needed (pain). 10/14/17   Debbe Odea, MD  Insulin Glargine (LANTUS SOLOSTAR) 100 UNIT/ML Solostar Pen Inject 48 Units into the skin daily.  05/04/16   [provider]  lidocaine (XYLOCAINE) 5 % ointment Apply 1 application topically as needed. 10/27/17   Jamse Arn, MD  losartan-hydrochlorothiazide (HYZAAR) 50-12.5 MG tablet Take 1 tablet by mouth daily. 03/09/16   [provider]  metFORMIN (GLUCOPHAGE) 500 MG tablet Take 500 mg by mouth 2 (two) times daily with a meal.  03/09/16   [provider]  ofloxacin (OCUFLOX) 0.3 % ophthalmic solution Place 2 drops into the right eye 4 (four) times daily. 12/23/17   Caccavale, Sophia, PA-C  omeprazole  (PRILOSEC) 40 MG capsule Take 40 mg by mouth daily. 03/09/16   [provider]  senna-docusate (SENOKOT-S) 8.6-50 MG tablet Take 1 tablet by mouth at bedtime as needed for mild constipation. 10/14/17   Debbe Odea, MD  sitaGLIPtin (JANUVIA) 50 MG tablet Take 50 mg by mouth daily.  05/04/16   [provider]  tetrahydrozoline 0.05 % ophthalmic solution Place 1 drop into the right eye daily as needed (redness/irritation).    [provider]    Family History Family History  Problem Relation Age of Onset  . CVA Mother        3 strokes  . Alzheimer's disease Mother   . Colon cancer Neg Hx     Social History Social History   Tobacco Use  . Smoking status: Current Every Day Smoker    Packs/day: 0.50    Years: 39.00    Pack years: 19.50    Types: Cigarettes    Start date: 04/03/1977  . Smokeless tobacco: Never Used  Substance Use Topics  . Alcohol use: No  . Drug use: Yes    Types: Cocaine    Comment: last used last night      Allergies   Clonazepam   Review of Systems Review of Systems  Constitutional: Negative for fever.  Eyes: Negative for photophobia.  Respiratory: Negative for shortness of breath.   Cardiovascular: Negative for chest pain.  Gastrointestinal: Negative for abdominal pain.  Genitourinary: Negative for genital sores and hematuria.  Musculoskeletal: Negative for back pain, gait problem, joint swelling, myalgias, neck pain and neck stiffness.  Skin: Positive for wound. Negative for color change, pallor and rash.  Neurological: Negative for headaches.  Psychiatric/Behavioral: Negative for confusion.  All other systems reviewed and are negative.    Physical Exam Updated Vital Signs BP 133/70 (BP Location: Left Arm)   Pulse 87   Temp 98.2 F (36.8 C)   Resp 16   SpO2 96%   Physical Exam  Constitutional: He is oriented to person, place, and time. He appears well-developed and well-nourished.  HENT:  Head: Normocephalic  and atraumatic.  Mouth/Throat: No oropharyngeal exudate.  Eyes: Pupils are equal, round, and reactive to light. Conjunctivae are normal.  Neck: Normal range of motion. Neck supple.  Cardiovascular: Normal rate, regular rhythm, normal heart sounds and intact distal pulses.  Pulmonary/Chest: Effort normal and breath sounds normal. No stridor. He has no wheezes. He has no rales.  Abdominal: Soft. Bowel sounds are normal. He exhibits no mass. There is no tenderness. There is no rebound and no guarding.  Musculoskeletal: Normal range of motion. He exhibits no edema, tenderness or deformity.       Hands: Neurological: He is alert and oriented to person, place, and time. He displays normal reflexes. He exhibits normal muscle tone.  Skin: Skin is warm and dry. Capillary refill takes less than 2 seconds.  Psychiatric: He has a normal mood and affect.     ED Treatments / Results  Labs (all labs ordered are listed, but only abnormal results are displayed) Labs Reviewed  CBG MONITORING, ED - Abnormal; Notable for the following components:      Result Value   Glucose-Capillary 236 (*)    All other components within normal limits    EKG None  Radiology Dg Hip Unilat W Or Wo Pelvis 2-3 Views Left  Result Date: 03/18/2018 CLINICAL DATA:  Left hip pain radiating to the left leg today. No injury. EXAM: DG HIP (WITH OR WITHOUT PELVIS) 2-3V LEFT COMPARISON:  None. FINDINGS: There is no evidence of hip fracture or dislocation. There is no evidence of arthropathy or other focal bone abnormality. IMPRESSION: Negative. Electronically Signed   By: Lucienne Capers M.D.   On: 03/18/2018 05:05    Procedures Procedures (including critical care time)  Medications Ordered in ED Medications  bacitracin ointment (1 application Topical Given 03/18/18 0456)  lidocaine (LIDODERM) 5 % 1 patch (1 patch Transdermal Patch Applied 03/18/18 0456)  doxycycline (VIBRA-TABS) tablet 100 mg (100 mg Oral Given 03/18/18 0456)   naproxen (NAPROSYN) tablet 375 mg (375 mg Oral Given 03/18/18 0456)       Final Clinical Impressions(s) / ED Diagnoses   Return for fevers >100.4 unrelieved by medication, shortness of breath, intractable vomiting, or diarrhea, Inability to tolerate liquids or food, cough, altered mental status or any concerns. No signs of systemic illness or infection. The patient is nontoxic-appearing on exam and vital signs are within normal limits.   I have reviewed the triage vital signs and the nursing notes. Pertinent labs &imaging results that were available during my care of the patient were reviewed by me and considered in my medical decision making (see chart for details).  After history, exam, and medical workup I feel the patient has been appropriately medically screened and is safe for discharge home. Pertinent diagnoses were discussed with the patient. Patient was given return precautions.      Alleya Demeter, MD 03/18/18 [phone]

## 2018-03-18 NOTE — ED Triage Notes (Signed)
Patient presents with multiple complaints: left hip pain radiating to left leg today , denies injury ; right knuckle abrasion last week while working on a car  and left index finger blister 3 days ago sustained when he accidentally touched a hot pot .

## 2018-04-24 ENCOUNTER — Emergency Department (HOSPITAL_COMMUNITY)
Admission: EM | Admit: 2018-04-24 | Discharge: 2018-04-24 | Disposition: A | Discharge status: Home / Self Care | Payer: Medicare HMO | Attending: Emergency Medicine | Admitting: Emergency Medicine

## 2018-04-24 ENCOUNTER — Encounter (HOSPITAL_COMMUNITY): Payer: Self-pay | Admitting: Emergency Medicine

## 2018-04-24 ENCOUNTER — Other Ambulatory Visit: Payer: Self-pay

## 2018-04-24 ENCOUNTER — Emergency Department (HOSPITAL_COMMUNITY): Payer: Medicare HMO

## 2018-04-24 DIAGNOSIS — R197 Diarrhea, unspecified: Secondary | ICD-10-CM | POA: Diagnosis not present

## 2018-04-24 DIAGNOSIS — I10 Essential (primary) hypertension: Secondary | ICD-10-CM | POA: Insufficient documentation

## 2018-04-24 DIAGNOSIS — E119 Type 2 diabetes mellitus without complications: Secondary | ICD-10-CM | POA: Diagnosis not present

## 2018-04-24 DIAGNOSIS — Z794 Long term (current) use of insulin: Secondary | ICD-10-CM | POA: Diagnosis not present

## 2018-04-24 DIAGNOSIS — F17210 Nicotine dependence, cigarettes, uncomplicated: Secondary | ICD-10-CM | POA: Insufficient documentation

## 2018-04-24 DIAGNOSIS — Z7982 Long term (current) use of aspirin: Secondary | ICD-10-CM | POA: Insufficient documentation

## 2018-04-24 DIAGNOSIS — Z79899 Other long term (current) drug therapy: Secondary | ICD-10-CM | POA: Diagnosis not present

## 2018-04-24 DIAGNOSIS — R109 Unspecified abdominal pain: Secondary | ICD-10-CM | POA: Insufficient documentation

## 2018-04-24 DIAGNOSIS — R112 Nausea with vomiting, unspecified: Secondary | ICD-10-CM | POA: Insufficient documentation

## 2018-04-24 LAB — URINALYSIS, ROUTINE W REFLEX MICROSCOPIC
Bacteria, UA: NONE SEEN
Bilirubin Urine: NEGATIVE
KETONES UR: NEGATIVE mg/dL
Leukocytes, UA: NEGATIVE
NITRITE: NEGATIVE
PH: 5 (ref 5.0–8.0)
PROTEIN: 100 mg/dL — AB
Specific Gravity, Urine: 1.023 (ref 1.005–1.030)

## 2018-04-24 LAB — COMPREHENSIVE METABOLIC PANEL
ALT: 20 U/L (ref 0–44)
ANION GAP: 12 (ref 5–15)
AST: 19 U/L (ref 15–41)
Albumin: 3.5 g/dL (ref 3.5–5.0)
Alkaline Phosphatase: 75 U/L (ref 38–126)
BILIRUBIN TOTAL: 0.5 mg/dL (ref 0.3–1.2)
BUN: 23 mg/dL — ABNORMAL HIGH (ref 6–20)
CO2: 21 mmol/L — ABNORMAL LOW (ref 22–32)
Calcium: 9.3 mg/dL (ref 8.9–10.3)
Chloride: 103 mmol/L (ref 98–111)
Creatinine, Ser: 1.75 mg/dL — ABNORMAL HIGH (ref 0.61–1.24)
GFR, EST AFRICAN AMERICAN: 49 mL/min — AB (ref >60)
GFR, EST NON AFRICAN AMERICAN: 42 mL/min — AB (ref >60)
Glucose, Bld: 285 mg/dL — ABNORMAL HIGH (ref 70–99)
POTASSIUM: 4.1 mmol/L (ref 3.5–5.1)
Sodium: 136 mmol/L (ref 135–145)
TOTAL PROTEIN: 6.8 g/dL (ref 6.5–8.1)

## 2018-04-24 LAB — CBC
HCT: 43.8 % (ref 39.0–52.0)
HEMOGLOBIN: 13.2 g/dL (ref 13.0–17.0)
MCH: 27.6 pg (ref 26.0–34.0)
MCHC: 30.1 g/dL (ref 30.0–36.0)
MCV: 91.6 fL (ref 80.0–100.0)
PLATELETS: 302 10*3/uL (ref 150–400)
RBC: 4.78 MIL/uL (ref 4.22–5.81)
RDW: 14.5 % (ref 11.5–15.5)
WBC: 9.8 10*3/uL (ref 4.0–10.5)
nRBC: 0 % (ref 0.0–0.2)

## 2018-04-24 LAB — LIPASE, BLOOD: Lipase: 49 U/L (ref 11–51)

## 2018-04-24 MED ORDER — ONDANSETRON HCL 4 MG PO TABS: 4.0000 mg | ORAL_TABLET | Freq: Four times a day (QID) | ORAL | 0 refills | Status: DC

## 2018-04-24 MED ORDER — SODIUM CHLORIDE 0.9 % IV BOLUS
1000.0000 mL | Freq: Once | INTRAVENOUS | Status: AC
  Administered 2018-04-24: 1000 mL via INTRAVENOUS

## 2018-04-24 NOTE — ED Provider Notes (Signed)
Of left-sided abdominal pain, left mid abdomen, crampy, almost constant for the past 2 days.  He reports 20 episodes of diarrhea last night and approximate 10 episodes of diarrhea 2 days ago.  He vomited 3 x 2 days ago.  No nausea at present.  No fever.  He treated himself with naproxen, without relief.  He denies recent travel or recent use of antibiotics.  Episode of diarrhea 5 AM today.  No blood per rectum.  No hematemesis   Orlie Dakin, MD 04/24/18 [phone]

## 2018-04-24 NOTE — ED Triage Notes (Signed)
Pt. Stated, Donald Ross had bad stomach cramps going to the bathroom with diarrhea

## 2018-04-24 NOTE — Discharge Instructions (Signed)
Take Zofran every 6 hours as needed for nausea or vomiting.  Make sure to drink plenty of fluids.  Please see your doctor if your symptoms are not improving over the next 2 to 3 days.  Please return to emergency department if you develop any fever over 100.4, localizing, severe abdominal pain, bloody stools, or any other new or concerning symptom.

## 2018-04-24 NOTE — ED Provider Notes (Signed)
[facility] DEPART[facility] Provider Note   CSN: [id number] Arrival date & time: 04/24/18  9147     History   Chief Complaint Chief Complaint  Patient presents with  . Abdominal Cramping  . Emesis  . Diarrhea    HPI Donald Ross is a 57 y.o. male with history of HIV, hypertension, diabetes, GERD, stroke, CKD who presents with a 2-day history of abdominal pain, nausea, vomiting, and diarrhea.  Patient reports 2 days ago he began having copious nausea and vomiting, however now he is just been having diarrhea and severe abdominal cramping anytime he tries to eat or drink anything.  He denies any bloody stools.  He denies any fevers, chest pain, shortness of breath, urinary symptoms, back pain.  He is taking Naprosyn at home without relief.  HPI  Past Medical History:  Diagnosis Date  . Allergy   . Anxiety   . Depression   . Diabetes mellitus without complication ([place])   . GERD (gastroesophageal reflux disease)   . HIV infection ([place])   . Hypertension   . Kidney stones 2016  . Numbness   . Pain   . Stroke [facility]) 10/07/2015    Patient Active Problem List   Diagnosis Date Noted  . Depression 03/08/2018  . Abscess and osteomyelitis of left index finger 10/14/2017  . Abscess of finger of left hand 10/11/2017  . Osteomyelitis ([place]) 10/11/2017  . CKD (chronic kidney disease) 10/11/2017  . Drug abuse ([place]) 10/11/2017  . Hypertension 04/12/2017  . Pain 03/09/2017  . Neuropathy due to secondary diabetes ([place]) 08/05/2016  . Hyperlipidemia associated with type 2 diabetes mellitus ([place]) 08/05/2016  . Asymptomatic HIV infection ([place]) 04/03/2016  . Diabetes mellitus type 2 with complications ([place]) 82/95/6213  . CVA (cerebral vascular accident) ([place]) 04/03/2016  . Tobacco abuse 04/03/2016    Past Surgical History:  Procedure Laterality Date  . AMPUTATION Left 10/11/2017   Procedure: LEFT INDEX FINGER AMPUTATION;  Surgeon: Milly Jakob, MD;  Location:  [facility];  Service: Orthopedics;  Laterality: Left;  . EYE SURGERY Bilateral 1968  . LITHOTRIPSY  2016  . TENDON REPAIR Left 2016   thumb        Home Medications    Prior to Admission medications   Medication Sig Start Date End Date Taking? Authorizing Provider  abacavir-dolutegravir-lamiVUDine (TRIUMEQ) 600-50-300 MG tablet Take 1 tablet by mouth daily. 04/12/17  Yes Campbell Riches, MD  atorvastatin (LIPITOR) 10 MG tablet Take 10 mg by mouth daily. 09/16/16 06/15/18 Yes [provider]  cyclobenzaprine (FLEXERIL) 10 MG tablet Take 10 mg by mouth at bedtime.   Yes [provider]  fenofibrate micronized (LOFIBRA) 134 MG capsule Take 134 mg by mouth daily before breakfast. 03/09/16  Yes [provider]  Insulin Glargine (LANTUS SOLOSTAR) 100 UNIT/ML Solostar Pen Inject 48 Units into the skin daily.  05/04/16  Yes [provider]  lidocaine (LIDODERM) 5 % Place 1 patch onto the skin daily. Remove & Discard patch within 12 hours or as directed by MD 03/18/18  Yes Palumbo, April, MD  losartan-hydrochlorothiazide (HYZAAR) 50-12.5 MG tablet Take 1 tablet by mouth daily. 03/09/16  Yes [provider]  metFORMIN (GLUCOPHAGE) 500 MG tablet Take 500 mg by mouth 2 (two) times daily with a meal.  03/09/16  Yes [provider]  omeprazole (PRILOSEC) 40 MG capsule Take 40 mg by mouth daily. 03/09/16  Yes [provider]  oxyCODONE (ROXICODONE) 15 MG immediate release tablet Take 15  mg by mouth every 6 (six) hours. 03/16/18  Yes [provider]  pregabalin (LYRICA) 150 MG capsule Take 150 mg by mouth 3 (three) times daily. 03/16/18  Yes [provider]  sitaGLIPtin (JANUVIA) 50 MG tablet Take 50 mg by mouth daily.  05/04/16  Yes [provider]  amitriptyline (ELAVIL) 50 MG tablet TAKE 1 TABLET BY MOUTH EVERYDAY AT BEDTIME Patient not taking: No sig reported 02/14/18   Jamse Arn, MD  aspirin EC 81 MG tablet Take 1  tablet (81 mg total) by mouth daily. Patient not taking: Reported on 12/23/2017 10/14/17 11/13/17  Debbe Odea, MD  HYDROcodone-acetaminophen (NORCO/VICODIN) 5-325 MG tablet Take 1 tablet by mouth every 6 (six) hours as needed (pain). Patient not taking: Reported on 04/24/2018 10/14/17   Debbe Odea, MD  lidocaine (XYLOCAINE) 5 % ointment Apply 1 application topically as needed. Patient not taking: Reported on 04/24/2018 10/27/17   Jamse Arn, MD  naproxen (NAPROSYN) 500 MG tablet Take 1 tablet (500 mg total) by mouth 2 (two) times daily. Patient not taking: Reported on 04/24/2018 03/18/18   Palumbo, April, MD  ofloxacin (OCUFLOX) 0.3 % ophthalmic solution Place 2 drops into the right eye 4 (four) times daily. Patient not taking: Reported on 04/24/2018 12/23/17   Caccavale, Sophia, PA-C  ondansetron (ZOFRAN) 4 MG tablet Take 1 tablet (4 mg total) by mouth every 6 (six) hours. 04/24/18   Shakaria Raphael, Bea Graff, PA-C  senna-docusate (SENOKOT-S) 8.6-50 MG tablet Take 1 tablet by mouth at bedtime as needed for mild constipation. Patient not taking: Reported on 04/24/2018 10/14/17   Debbe Odea, MD    Family History Family History  Problem Relation Age of Onset  . CVA Mother        3 strokes  . Alzheimer's disease Mother   . Colon cancer Neg Hx     Social History Social History   Tobacco Use  . Smoking status: Current Every Day Smoker    Packs/day: 0.50    Years: 39.00    Pack years: 19.50    Types: Cigarettes    Start date: 04/03/1977  . Smokeless tobacco: Never Used  Substance Use Topics  . Alcohol use: No  . Drug use: Yes    Types: Cocaine    Comment: last used last night      Allergies   Clonazepam   Review of Systems Review of Systems  Constitutional: Negative for chills and fever.  HENT: Negative for facial swelling and sore throat.   Respiratory: Negative for shortness of breath.   Cardiovascular: Negative for chest pain.  Gastrointestinal: Positive for abdominal  pain, diarrhea, nausea and vomiting. Negative for blood in stool.  Genitourinary: Negative for dysuria.  Musculoskeletal: Negative for back pain.  Skin: Negative for rash and wound.  Neurological: Negative for headaches.  Psychiatric/Behavioral: The patient is not nervous/anxious.      Physical Exam Updated Vital Signs BP (!) 159/108 (BP Location: Right Arm)   Pulse 86   Temp 98.6 F (37 C) (Oral)   Resp 16   Ht 5\' 8"  (1.727 m)   Wt 88.9 kg   SpO2 99%   BMI 29.80 kg/m   Physical Exam  Constitutional: He appears well-developed and well-nourished. No distress.  HENT:  Head: Normocephalic and atraumatic.  Mouth/Throat: Oropharynx is clear and moist. No oropharyngeal exudate.  Eyes: Pupils are equal, round, and reactive to light. Conjunctivae are normal. Right eye exhibits no discharge. Left eye exhibits no discharge. No scleral icterus.  Neck: Normal range of motion. Neck supple. No thyromegaly present.  Cardiovascular: Normal rate, regular rhythm, normal heart sounds and intact distal pulses. Exam reveals no gallop and no friction rub.  No murmur heard. Pulmonary/Chest: Effort normal and breath sounds normal. No stridor. No respiratory distress. He has no wheezes. He has no rales.  Abdominal: Soft. Bowel sounds are normal. He exhibits no distension. There is tenderness. There is no rebound and no guarding.    Musculoskeletal: He exhibits no edema.  Lymphadenopathy:    He has no cervical adenopathy.  Neurological: He is alert. Coordination normal.  Skin: Skin is warm and dry. No rash noted. He is not diaphoretic. No pallor.  Psychiatric: He has a normal mood and affect.  Nursing note and vitals reviewed.    ED Treatments / Results  Labs (all labs ordered are listed, but only abnormal results are displayed) Labs Reviewed  COMPREHENSIVE METABOLIC PANEL - Abnormal; Notable for the following components:      Result Value   CO2 21 (*)    Glucose, Bld 285 (*)    BUN 23 (*)     Creatinine, Ser 1.75 (*)    GFR calc non Af Amer 42 (*)    GFR calc Af Amer 49 (*)    All other components within normal limits  URINALYSIS, ROUTINE W REFLEX MICROSCOPIC - Abnormal; Notable for the following components:   Glucose, UA >=500 (*)    Hgb urine dipstick SMALL (*)    Protein, ur 100 (*)    All other components within normal limits  LIPASE, BLOOD  CBC    EKG None  Radiology Ct Abdomen Pelvis Wo Contrast  Result Date: 04/24/2018 CLINICAL DATA:  Abdominal pain, nausea/vomiting/diarrhea EXAM: CT ABDOMEN AND PELVIS WITHOUT CONTRAST TECHNIQUE: Multidetector CT imaging of the abdomen and pelvis was performed following the standard protocol without IV contrast. COMPARISON:  08/17/2017 FINDINGS: Lower chest: Lung bases are essentially clear. Hepatobiliary: Unenhanced liver is unremarkable. Gallbladder is unremarkable. No intrahepatic or extrahepatic ductal dilatation. Pancreas: Within normal limits. Spleen: Within normal limits. Adrenals/Urinary Tract: Adrenal glands are within normal limits. 3.3 cm hyperdense left lower pole renal cyst, better evaluated on prior enhanced CT. Bilateral nonobstructing renal calculi, measuring up to 5 mm in the left lower pole. No ureteral or bladder calculi. No hydronephrosis. Thick-walled bladder, although underdistended. Stomach/Bowel: Stomach is within normal limits. No evidence of bowel obstruction. Normal appendix (series 3/image 55). No colonic wall thickening or inflammatory changes. Specifically, no evidence of diverticulitis. Vascular/Lymphatic: No evidence of abdominal aortic aneurysm. Atherosclerotic calcifications of the abdominal aorta and branch vessels. No suspicious abdominopelvic lymphadenopathy. Reproductive: Prostate is unremarkable. Other: No abdominopelvic ascites. Musculoskeletal: Visualized osseous structures are within normal limits. IMPRESSION: Bilateral nonobstructing renal calculi, measuring up to 5 mm in the left lower pole. No  ureteral or bladder calculi. No hydronephrosis. Mildly thick-walled bladder, correlate for cystitis. No evidence of diverticulitis. No evidence of bowel obstruction. Normal appendix. Electronically Signed   By: Julian Hy M.D.   On: 04/24/2018 12:34    Procedures Procedures (including critical care time)  Medications Ordered in ED Medications  sodium chloride 0.9 % bolus 1,000 mL (0 mLs Intravenous Stopped 04/24/18 1254)     Initial Impression / Assessment and Plan / ED Course  I have reviewed the triage vital signs and the nursing notes.  Pertinent labs & imaging results that were available during my care of the patient were reviewed by me and considered in my medical decision making (see  chart for details).     Patient presenting with a 2-day history of nausea, vomiting, diarrhea.  He has had severe abdominal cramping. Labs are stable for the patient.  Abdomen was tender on my exam.  CT abdomen pelvis was conducted to rule out diverticulitis and appendicitis.  CT abdomen pelvis is negative for acute findings except for kidney stones in the kidneys.  Patient is feeling better in the ED and had no episodes of diarrhea or vomiting.  Will discharge home with Zofran.  Suspect viral versus food related cause.  Strict return precautions given.  Patient advised to follow-up with his PCP.  Patient understands and agrees with plan.  Patient vitals stable throughout ED course and discharged in satisfactory condition.  Patient also evaluated by my attending, Dr. Winfred Leeds, who guided the patient's management and agrees with plan.  Final Clinical Impressions(s) / ED Diagnoses   Final diagnoses:  Nausea vomiting and diarrhea  Abdominal cramping    ED Discharge Orders         Ordered    ondansetron (ZOFRAN) 4 MG tablet  Every 6 hours     04/24/18 1257           Averie Meiner, [place], PA-C 04/24/18 1504    Orlie Dakin, MD 04/24/18 1645

## 2018-05-05 ENCOUNTER — Other Ambulatory Visit: Payer: Self-pay | Admitting: Infectious Diseases

## 2018-05-05 DIAGNOSIS — Z21 Asymptomatic human immunodeficiency virus [HIV] infection status: Secondary | ICD-10-CM

## 2018-05-05 DIAGNOSIS — B20 Human immunodeficiency virus [HIV] disease: Secondary | ICD-10-CM

## 2018-05-07 ENCOUNTER — Encounter (HOSPITAL_COMMUNITY): Payer: Self-pay | Admitting: *Deleted

## 2018-05-07 ENCOUNTER — Other Ambulatory Visit: Payer: Self-pay

## 2018-05-07 ENCOUNTER — Ambulatory Visit (INDEPENDENT_AMBULATORY_CARE_PROVIDER_SITE_OTHER): Payer: Medicare HMO

## 2018-05-07 ENCOUNTER — Ambulatory Visit (HOSPITAL_COMMUNITY)
Admission: EM | Admit: 2018-05-07 | Discharge: 2018-05-07 | Disposition: A | Discharge status: Home / Self Care | Payer: Medicare HMO | Attending: Family Medicine | Admitting: Family Medicine

## 2018-05-07 DIAGNOSIS — Z8673 Personal history of transient ischemic attack (TIA), and cerebral infarction without residual deficits: Secondary | ICD-10-CM | POA: Insufficient documentation

## 2018-05-07 DIAGNOSIS — F419 Anxiety disorder, unspecified: Secondary | ICD-10-CM | POA: Insufficient documentation

## 2018-05-07 DIAGNOSIS — R1084 Generalized abdominal pain: Secondary | ICD-10-CM | POA: Diagnosis not present

## 2018-05-07 DIAGNOSIS — R197 Diarrhea, unspecified: Secondary | ICD-10-CM

## 2018-05-07 DIAGNOSIS — I10 Essential (primary) hypertension: Secondary | ICD-10-CM | POA: Insufficient documentation

## 2018-05-07 DIAGNOSIS — R109 Unspecified abdominal pain: Secondary | ICD-10-CM | POA: Diagnosis present

## 2018-05-07 DIAGNOSIS — F329 Major depressive disorder, single episode, unspecified: Secondary | ICD-10-CM | POA: Insufficient documentation

## 2018-05-07 DIAGNOSIS — N289 Disorder of kidney and ureter, unspecified: Secondary | ICD-10-CM

## 2018-05-07 DIAGNOSIS — K219 Gastro-esophageal reflux disease without esophagitis: Secondary | ICD-10-CM | POA: Insufficient documentation

## 2018-05-07 DIAGNOSIS — R7611 Nonspecific reaction to tuberculin skin test without active tuberculosis: Secondary | ICD-10-CM | POA: Insufficient documentation

## 2018-05-07 DIAGNOSIS — E1165 Type 2 diabetes mellitus with hyperglycemia: Secondary | ICD-10-CM | POA: Insufficient documentation

## 2018-05-07 DIAGNOSIS — R000 Tachycardia, unspecified: Secondary | ICD-10-CM

## 2018-05-07 DIAGNOSIS — Z87442 Personal history of urinary calculi: Secondary | ICD-10-CM | POA: Insufficient documentation

## 2018-05-07 LAB — POCT I-STAT, CHEM 8
BUN: 21 mg/dL — AB (ref 6–20)
CREATININE: 2.2 mg/dL — AB (ref 0.61–1.24)
Calcium, Ion: 1.18 mmol/L (ref 1.15–1.40)
Chloride: 103 mmol/L (ref 98–111)
GLUCOSE: 143 mg/dL — AB (ref 70–99)
HEMATOCRIT: 45 % (ref 39.0–52.0)
Hemoglobin: 15.3 g/dL (ref 13.0–17.0)
POTASSIUM: 4.2 mmol/L (ref 3.5–5.1)
Sodium: 139 mmol/L (ref 135–145)
TCO2: 27 mmol/L (ref 22–32)

## 2018-05-07 NOTE — ED Triage Notes (Signed)
C/O "feels like a brick sitting in my stomach" and low abd cramping with diarrhea with any eating or drinking.  Was seen in ED 10/27.  Continues with sxs.

## 2018-05-07 NOTE — Discharge Instructions (Addendum)
You have been seen today for abdominal pain and diarrhea. Your evaluation was not suggestive of any emergent condition requiring medical intervention at this time. However, some problems make take more time to appear. Therefore, it is important for you to watch for any new symptoms or worsening of your current condition. Please proceed to the Emergency Department immediately should you feel worse in any way or have any of the following symptoms: increasing or different abdominal pain, persistent vomiting, fevers, or shaking chills.

## 2018-05-08 ENCOUNTER — Telehealth (HOSPITAL_BASED_OUTPATIENT_CLINIC_OR_DEPARTMENT_OTHER): Payer: Self-pay | Admitting: Emergency Medicine

## 2018-05-08 LAB — GASTROINTESTINAL PANEL BY PCR, STOOL (REPLACES STOOL CULTURE)
ASTROVIRUS: NOT DETECTED
Adenovirus F40/41: NOT DETECTED
CAMPYLOBACTER SPECIES: DETECTED — AB
Cryptosporidium: NOT DETECTED
Cyclospora cayetanensis: NOT DETECTED
ENTEROTOXIGENIC E COLI (ETEC): NOT DETECTED
Entamoeba histolytica: NOT DETECTED
Enteroaggregative E coli (EAEC): NOT DETECTED
Enteropathogenic E coli (EPEC): NOT DETECTED
Giardia lamblia: NOT DETECTED
NOROVIRUS GI/GII: NOT DETECTED
PLESIMONAS SHIGELLOIDES: NOT DETECTED
ROTAVIRUS A: NOT DETECTED
Salmonella species: NOT DETECTED
Sapovirus (I, II, IV, and V): NOT DETECTED
Shiga like toxin producing E coli (STEC): NOT DETECTED
Shigella/Enteroinvasive E coli (EIEC): NOT DETECTED
Vibrio cholerae: NOT DETECTED
Vibrio species: NOT DETECTED
Yersinia enterocolitica: NOT DETECTED

## 2018-05-09 ENCOUNTER — Telehealth (HOSPITAL_BASED_OUTPATIENT_CLINIC_OR_DEPARTMENT_OTHER): Payer: Self-pay | Admitting: *Deleted

## 2018-05-09 ENCOUNTER — Telehealth (HOSPITAL_COMMUNITY): Payer: Self-pay | Admitting: Emergency Medicine

## 2018-05-09 NOTE — Telephone Encounter (Signed)
Attempted to reach patient x2. No answer at this time. Received recording "your call cannot be completed at this time"

## 2018-05-11 ENCOUNTER — Telehealth (HOSPITAL_COMMUNITY): Payer: Self-pay | Admitting: Emergency Medicine

## 2018-05-11 NOTE — Telephone Encounter (Signed)
Attempted to reach patient x3. No answer at this time. Received recording "your call cannot be completed at this time". Letter sent.

## 2018-06-08 NOTE — ED Provider Notes (Signed)
Donald Ross   [id number] 05/07/18 Arrival Time: 6213  ASSESSMENT & PLAN:  1. Generalized abdominal pain   2. Uncontrolled type 2 diabetes mellitus with hyperglycemia ([place])   3. Renal insufficiency   4. Diarrhea, unspecified type   5. Tachycardia    No indication for hospital evaluation or admission at this time. No need for further abdominal imaging. Will collect stool to send for PCR. Suspicion of bacterial etiology but want to confirm before initiating treatment. Encourage PO fluid intake. Increased Cr likely pre-renal. Discussed.   Discharge Instructions     You have been seen today for abdominal pain and diarrhea. Your evaluation was not suggestive of any emergent condition requiring medical intervention at this time. However, some problems make take more time to appear. Therefore, it is important for you to watch for any new symptoms or worsening of your current condition. Please proceed to the Emergency Department immediately should you feel worse in any way or have any of the following symptoms: increasing or different abdominal pain, persistent vomiting, fevers, or shaking chills.    Follow-up Information    Schedule an appointment as soon as possible for a visit  with Bernerd Limbo, MD.   Specialty:  Family Medicine Contact information: [address] [place] [address] [phone]        [place].   Specialty:  Emergency Medicine Why:  If symptoms worsen. Contact information: [address] [id number] [place] [phone]          Reviewed expectations re: course of current medical issues. Questions answered. Outlined signs and symptoms indicating need for more acute intervention. Patient verbalized understanding. After Visit Summary given.   SUBJECTIVE: History from: patient. Donald Ross is a 57 y.o. male who presents with complaint of  intermittent, non-radiating abdominal cramping with non-bloody diarrhea. Onset gradual a couple of weeks ago. Diarrhea is watery and without blood. Symptoms are unchanged since beginning. Fever: absent. Aggravating factors: include eating. Alleviating factors: have not been identified. Associated symptoms: fatigue. He denies belching, constipation, nausea, sweats and vomiting. Appetite: overall normal. PO intake: decreased; "I eat something and it goes right through me." Ambulatory without assistance. Urinary symptoms: none.  History of similar: no. No recent travel. No sick contacts with similar. Seen in ED on 10/27; note reviewed; negative abd/pelvic CT. Dx with viral GI illness. Given Zofran. No current nausea reported.  OTC treatment: none reported.  Past Surgical History:  Procedure Laterality Date  . AMPUTATION Left 10/11/2017   Procedure: LEFT INDEX FINGER AMPUTATION;  Surgeon: Milly Jakob, MD;  Location: [facility];  Service: Orthopedics;  Laterality: Left;  . EYE SURGERY Bilateral 1968  . LITHOTRIPSY  2016  . TENDON REPAIR Left 2016   thumb   ROS: As per HPI. All other systems negative.  OBJECTIVE:  Vitals:   05/07/18 1058  BP: (!) 131/92  Pulse: (!) 114  Resp: 16  Temp: 97.9 F (36.6 C)  TempSrc: Oral  SpO2: 100%    General appearance: alert, oriented, no acute distress; does appear fatigued Lungs: clear to auscultation bilaterally; unlabored respirations Heart: tachycardic; regular Abdomen: soft; with distention; mild tenderness, epigastric; normal bowel sounds; without masses or organomegaly; without guarding or rebound tenderness Back: without CVA tenderness; FROM at waist Extremities: without LE edema; symmetrical; without gross deformities Skin: warm and dry Neurologic: normal gait Psychological: alert and cooperative; normal mood and affect  Labs:  Labs Reviewed  GASTROINTESTINAL PANEL BY  PCR, STOOL (REPLACES STOOL CULTURE)  POCT I-STAT, CHEM 8 - Abnormal;  Notable for the following components:   BUN 21 (*)    Creatinine, Ser 2.20 (*)    Glucose, Bld 143 (*)    All other components within normal limits     Allergies  Allergen Reactions  . Clonazepam Swelling                                               Past Medical History:  Diagnosis Date  . Allergy   . Anxiety   . Depression   . Diabetes mellitus without complication ([place])   . GERD (gastroesophageal reflux disease)   . HIV infection ([place])   . Hypertension   . Kidney stones 2016  . Numbness   . Pain   . Stroke [facility]) 10/07/2015   Social History   Socioeconomic History  . Marital status: Legally Separated    Spouse name: Not on file  . Number of children: 2  . Years of education: GED  . Highest education level: Not on file  Occupational History  . Occupation: Unemployed  Social Needs  . Financial resource strain: Not on file  . Food insecurity:    Worry: Not on file    Inability: Not on file  . Transportation needs:    Medical: Not on file    Non-medical: Not on file  Tobacco Use  . Smoking status: Current Every Day Smoker    Packs/day: 0.50    Years: 39.00    Pack years: 19.50    Types: Cigarettes    Start date: 04/03/1977  . Smokeless tobacco: Never Used  Substance and Sexual Activity  . Alcohol use: Yes    Comment: occasionally  . Drug use: Not Currently    Types: Cocaine  . Sexual activity: Not on file  Lifestyle  . Physical activity:    Days per week: Not on file    Minutes per session: Not on file  . Stress: Not on file  Relationships  . Social connections:    Talks on phone: Not on file    Gets together: Not on file    Attends religious service: Not on file    Active member of club or organization: Not on file    Attends meetings of clubs or organizations: Not on file    Relationship status: Not on file  . Intimate partner violence:    Fear of current or ex partner: Not on file    Emotionally abused: Not on file    Physically abused: Not  on file    Forced sexual activity: Not on file  Other Topics Concern  . Not on file  Social History Narrative   Lives at home with fiancee.   Right-handed.   Drinks 3-liter of soda every three days.   Family History  Problem Relation Age of Onset  . CVA Mother        3 strokes  . Alzheimer's disease Mother   . Colon cancer Neg Hx      Vanessa Kick, MD 06/08/18 [phone]

## 2018-06-10 ENCOUNTER — Other Ambulatory Visit: Payer: Self-pay | Admitting: Infectious Diseases

## 2018-06-10 DIAGNOSIS — B20 Human immunodeficiency virus [HIV] disease: Secondary | ICD-10-CM

## 2018-06-10 DIAGNOSIS — Z21 Asymptomatic human immunodeficiency virus [HIV] infection status: Secondary | ICD-10-CM

## 2018-07-27 ENCOUNTER — Telehealth (HOSPITAL_COMMUNITY): Payer: Self-pay | Admitting: Emergency Medicine

## 2018-07-27 NOTE — Telephone Encounter (Signed)
Letter sent to patient returned to sender.

## 2018-08-12 ENCOUNTER — Telehealth: Payer: Self-pay | Admitting: *Deleted

## 2018-08-12 DIAGNOSIS — B20 Human immunodeficiency virus [HIV] disease: Secondary | ICD-10-CM

## 2018-08-12 DIAGNOSIS — Z21 Asymptomatic human immunodeficiency virus [HIV] infection status: Secondary | ICD-10-CM

## 2018-08-12 MED ORDER — ABACAVIR-DOLUTEGRAVIR-LAMIVUD 600-50-300 MG PO TABS: 1.0000 | ORAL_TABLET | Freq: Every day | ORAL | 0 refills | Status: AC

## 2018-08-12 NOTE — Telephone Encounter (Signed)
Patient called, let Dr Johnnye Sima know that he has moved back to [place], [place].  He is asking for a refill of triumeq to be sent locally ([place], [place]). He is returning to care at his previous clinic, will call them today to get an appointment.  RN gave him the clinic's fax number for a signed release to send his records from our office. Landis Gandy, RN

## 2018-08-12 NOTE — Telephone Encounter (Signed)
Ok to refill x 1  

## 2018-08-31 ENCOUNTER — Other Ambulatory Visit: Payer: Medicare HMO

## 2018-09-09 ENCOUNTER — Other Ambulatory Visit: Payer: Self-pay | Admitting: Infectious Diseases

## 2018-09-09 DIAGNOSIS — Z21 Asymptomatic human immunodeficiency virus [HIV] infection status: Secondary | ICD-10-CM

## 2018-09-09 DIAGNOSIS — B20 Human immunodeficiency virus [HIV] disease: Secondary | ICD-10-CM

## 2018-09-14 ENCOUNTER — Ambulatory Visit: Payer: Medicare HMO | Admitting: Infectious Diseases

## 2018-12-19 ENCOUNTER — Other Ambulatory Visit: Payer: Self-pay | Admitting: Infectious Diseases

## 2018-12-19 DIAGNOSIS — Z21 Asymptomatic human immunodeficiency virus [HIV] infection status: Secondary | ICD-10-CM

## 2018-12-19 DIAGNOSIS — B20 Human immunodeficiency virus [HIV] disease: Secondary | ICD-10-CM
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
## 2021-05-15 ENCOUNTER — Encounter: Payer: Self-pay | Admitting: Internal Medicine
